# Patient Record
Sex: Female | Born: 1955 | Race: White | Hispanic: No
Health system: Western US, Academic
[De-identification: ages and names within clinical notes are randomized; demographics above are authoritative.]

---

## 2017-09-04 NOTE — Progress Notes (Deleted)
09/04/2017    Referring provider: Elsie Saas, MD    Chief Complaint:No chief complaint on file.      HISTORY OF PRESENT ILLNESS  Tamara Black is a 62 y.o. female ***         REVIEW OF SYSTEMS  {ROS:27880::"All other systems reviewed and are negative."}  All other systems were reviewed and are negative.      PAST MEDICAL HISTORY  No past medical history on file.    PAST SURGICAL HISTORY   No past surgical history on file.    MEDICATIONS      ALLERGIES  Allergies/Contraindications  Allergies not on file    SOCIAL HISTORY  Social History     Socioeconomic History    Marital status: Divorced     Spouse name: Not on file    Number of children: Not on file    Years of education: Not on file    Highest education level: Not on file   Social Needs    Financial resource strain: Not on file    Food insecurity - worry: Not on file    Food insecurity - inability: Not on file    Transportation needs - medical: Not on file    Transportation needs - non-medical: Not on file   Occupational History    Not on file   Tobacco Use    Smoking status: Not on file   Substance and Sexual Activity    Alcohol use: Not on file    Drug use: Not on file    Sexual activity: Not on file   Other Topics Concern    Not on file   Social History Narrative    Not on file       FAMILY HISTORY  No family history on file.    I have reviewed the past medical, family, and social history sections including the medications and allergies listed in the above medical record.     PHYSICAL EXAM  There were no vitals taken for this visit.  {Physical Exam:[id number]}    RESULTS    Labs  No results found for: WBC, RBC, HGB, HCT, MCV, MCH, MCHC, PLT  No results found for: CREAT, GFRC, GFRAA  No results found for: ALKP, GGT, ALT, AST, TBILI, ALB  No results found for: ESR  No results found for: CRP    Radiology  No results found.  Radiology reviewed and interpreted as ***    {OUTSIDE RECORDS REVIEWED:22244}     ASSESSMENT:                 ***  PLAN:  ***  I spent a total of *** minutes face-to-face with the patient and *** minutes of that time was spent counseling regarding signs and symptoms of recurrent infection,  educated the patient about adverse signs and symptoms associated with antibiotic usage which includes Clostridium difficile colitis, drug rashes, and specific adverse effects associated with the present antimicrobials prescribed as well as prognosis of infection.

## 2017-09-08 ENCOUNTER — Ambulatory Visit: Admit: 2017-09-08 | Discharge: 2017-09-08 | Payer: Commercial Managed Care - PPO

## 2017-09-08 DIAGNOSIS — N390 Urinary tract infection, site not specified: Secondary | ICD-10-CM

## 2017-09-08 NOTE — H&P (Signed)
No show

## 2017-09-08 NOTE — Progress Notes (Signed)
This encounter was created in error - please disregard.

## 2017-11-17 ENCOUNTER — Ambulatory Visit: Attending: Infectious Disease

## 2017-11-17 DIAGNOSIS — B999 Unspecified infectious disease: Secondary | ICD-10-CM

## 2017-11-17 DIAGNOSIS — J029 Acute pharyngitis, unspecified: Secondary | ICD-10-CM

## 2017-11-17 DIAGNOSIS — N390 Urinary tract infection, site not specified: Secondary | ICD-10-CM

## 2017-11-17 DIAGNOSIS — Z8619 Personal history of other infectious and parasitic diseases: Secondary | ICD-10-CM

## 2017-11-17 MED ORDER — METHENAMINE HIPPURATE 1 G PO TABS
1 g | ORAL_TABLET | Freq: Two times a day (BID) | ORAL | 3 refills | Status: AC
Start: 2017-11-17 — End: ?

## 2017-11-17 NOTE — Progress Notes
INFECTIOUS DISEASES CONSULTATION    Patient: Janet Conway  MRN: [id number]  DOB: 1955-08-04  Date of Service: 11/17/2017  Requesting Physician: Charlett Nose.,*  Reason for Consultation: recurrent UTI    Chief Complaint:   Chief Complaint   Patient presents with   ??? Urinary Tract Infection     chronic       History of Present Illness:   Janet Conway is a 62 y.o. female history of melanoma here today for recurrent UTI    PCP Dr. Roswell Nickel ([facility] in [place])  Lives in [place] and [place], [place].  Comes to [facility] for medical care.  History of bilateral Nephrolithasis with lithotripsy in 11/2016 and 12/2016 at [facility]), all of renal calculi removed, performed by Dr. Laurian Brim (retired)  History of pyelonephritis in 1990s, needed IV antibiotics  Gets yeast infection with antibiotics    Frequently gets UTIs.  Started having recurrent UTIs in 1983.  Last UTI 08/2017 and 09/2017.  Reports UTI symptoms are typically urinary frequency, RLQ pain and bladder pain, and urine gets dark and cloudy.  Does not get dysuria typically.  Vaginal itching with antibiotics.    Has taken Macrodantin, Cipro, Bactrim, clindamycin in the past without allergy.  Tried post-coital antibiotics in the past.  Abstinent from intercourse for 7 years.  Underwent menopause at age 47.  +atrophic vaginitis.  No vaginal dryness.  Tried Premarin for a couple of weeks, no real improvement and has been off of it.    D-mannose stopped working.  Took for several years.  Vitamin C, taking 2000 mg a few times a day.  Still taking Vitamin C.   Has tried suppressive Bactrim and Nitrofurantoin in the past.  In terms of other treatment for her recurrent UTIs, she has also tried douching with yogurt.      Just returned from [place] yesterday after stayng here for 3 months since 08/2017.  Feels like she is having a sore throat today.  Developed dysuria in [place] and she did not go to the clinic and bought antibiotics OTC in [place]. Was on antibiotics for 3 months (Cipro) because the urinary symptoms kept returning.  Does not recall name of antibiotics that she purchased.  In [place], was diagnosed with possible SLE.  She received a betamethasone injection by a physician in [place]   History of BV, feels like she is having a fishy smell from her vagina   Has seen urologist and OB/gyn.  No urinary incontinence.    Diagnosed with Staph infection of finger in the past  Patient is concerned for colonization in the past.  Has taken Ceftin in the past and subsequently developed C.diff diarrhea  No allergies to cephalopsorins  Was on antibiotics for a number of years for acne (erythromycin)    Patient would also want to inquire if she has an underlying systemic infection or immunodeficiency that would explain her constellation of symptoms including recurrent infections including her recurrent UTIs, hair thinning, sensation of sore throat, throat fullness, prior history of vocal cord polyps which were removed, deepening of her voice.  +Prior history of smoking.    Review of Systems:  A 14-point review of systems was performed and is negative except for as noted above.     Past Medical History:  Past Medical History:   Diagnosis Date   ??? Depression    ??? Insomnia    ??? Melanoma (HCC/RAF)    ??? UTI (urinary tract infection)  Past Surgical History:  Past Surgical History:   Procedure Laterality Date   ??? LYMPH NODE BIOPSY         Allergies:   Allergies   Allergen Reactions   ??? Amoxicillin Anaphylaxis   ??? Azithromycin      Blisters around eyes and neck   ??? Cefuroxime      cdiff   Amoxicillin, difficulty breathing facial swelling, blisters on feet  Azithromycin, blisters around eyes and neck, no anaphylaixs    Medications:  Current Outpatient Prescriptions   Medication Sig   ??? amitriptyline 100 mg tablet Take 2 tablets by mouth at bedtime.     No current facility-administered medications for this visit.        Family History: Family History   Problem Relation Age of Onset   ??? Heart disease Mother    ??? Hypertension Mother    ??? Melanoma Father      Social History:  Social History     Social History   ??? Marital status: Married     Spouse name: N/A   ??? Number of children: N/A   ??? Years of education: N/A     Social History Main Topics   ??? Smoking status: Former Smoker   ??? Smokeless tobacco: Former Neurosurgeon   ??? Alcohol use No   ??? Drug use: No   ??? Sexual activity: Not Asked     Other Topics Concern   ??? None     Social History Narrative   ??? 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 Denies???drug or EtoH use.     Physical Exam:  Vitals:    11/17/17 1033   Weight: 149 lb (67.6 kg)   Height: 5' 8'' (1.727 m)     Last Recorded Vital Signs:    11/17/17 1033   BP: 130/79   Pulse: 89   Temp: 36.8 ???C (98.2 ???F)   SpO2: 97%     General: Non-toxic appearing well nourished, in no acute distress.  Eyes: Anicteric sclerae, no injection, extraocular movements are intact.  ENT: No oropharyngeal lesions, mmm, and normal dentition. Right cervical lymphadenopathy.    Neck: Supple with no appreciable masses.  Lymph: No cervical, supraclavicular, or axillary adenopathy bilaterally.  CV: Regular rate and rhythm. Normal S1 and S2. No appreciable murmurs, rubs, or gallops. 2+ distal pulses.   Pulm: Normal effort. Clear to auscultation bilaterally without rales, rhonchi, or wheezing.   GI: Soft, non-distended, and non-tender. Normoactive bowel sounds. No guarding or rebound. No CVA tenderness.    Musculoskeletal: No clubbing, cyanosis, or edema.   Skin: No visible rash or lesions. Has normal turgor  Neuro: Awake and alert, moving all 4 extremities. Grossly nonfocal exam.   Psych: Appropriate mood and affect. Normal judgment and insight.    Laboratory Data:   No results found for: WBC, HGB, HCT, MCV, PLT  No results found for: NA, K, CL, CO2, BUN, CREAT, CALCIUM, MG, PHOS  CrCl cannot be calculated (No order found.). No results found for: TOTPRO, ALBUMIN, BILITOT, BILICON, ALT, AST, ALKPHOS, GGT, AMYLASE, LIPASE    Outside labs  10/29/17 Cr 1.0    Microbiology:   08/07/17 Urine culture negative  05/23/17 Urine culture negative  05/07/18 Urine culture gram positive bacteria  12/27/16 urine culture negative    Imaging reviewed by me:     Radiology report reviewed 08/01/15:  CT A/P with contrast  Impression:   1. No acute process identified at  the abdomen or pelvis.  2. Moderate colonic stool volume which may represent  constipation.  3. Bilateral nonobstructive renal calculi. No hydronephrosis.    Assessment/Recommendations:   Diagnoses and all orders for this visit:    Recurrent infections.  Reports concerns for immunodeficiency given recurrent UTIs and recent history of staph infection of finger.  Overall low low suspicion for underlying immunodeficiency but will check T-cell sbuset and quantitative immunoglobulins  -     CBC & Auto Differential; Future  -     Comprehensive Metabolic Panel; Future  -     T-cell Subset; Future  -     Immunoglobulins, Quantitative (IgG, IgA, IgM); Future  -     IgG Subclasses Profile; Future  -     CBC  -     Differential, Automated    Recurrent UTI.  Reports recurrent UTI since 1983.  Treated in the past for nephrolithiasis with lithotripsy.    -     Urinalysis,Routine, CLINIC COLLECT TODAY; Future  -     Bacterial Culture Urine, Clean Catch; Future  -     UA,Dipstick  -     UA,Microscopic    History of staph infection.  History of staph infection of finger now resolved. Will check for coloinzation with S.aureus.  -     Staph aureus Culture, Nares; Future    Sore throat. Onset a few days ago.  Suspect viral etiology vs post nasal drip, no tonsillar exudates or erythema of orpharynx on exam  -     Bacterial Culture Throat; Future    Other orders  -     methenamine hippurate 1 g tablet; Take 1 tablet (1 g total) by mouth two (2) times daily. -counseled patient that my clinical suspicion for underlying immunodeficiency as the causative etiology of her recurrent UTIs is low   -check CBC, CMP, quant immunoglobulin, T-cell subset, U/A with reflex urine culture   -nares swab for S.aureus colonization  -recommend methenamine 1 g twice a day take with Vitamin C 2000 twice a day to three times a day for her recurrent UTI  -throat swab obtained  -return to ID clinic in 8 weks    Author:  Nanetta Batty. Go-Wheeler, MD 11/17/2017 11:04 AM

## 2017-11-18 LAB — CBC: MEAN CORPUSCULAR HEMOGLOBIN: 32.1 pg (ref 26.4–33.4)

## 2017-11-18 LAB — Immunoglobulins, Quantitative: IGG,SERUM: 987 mg/dL (ref 726–1521)

## 2017-11-18 LAB — Differential Automated: IMMATURE GRANULOCYTES%: 0.2 (ref 0.00–0.04)

## 2017-11-18 LAB — Comprehensive Metabolic Panel
BILIRUBIN,TOTAL: 0.3 mg/dL (ref 0.1–1.2)
SODIUM: 141 mmol/L (ref 135–146)

## 2017-11-18 LAB — T-cell Subset: CD4, T-CELL HELPER/INDUCER SUBSET: 40 %{positive} (ref 28–63)

## 2017-11-18 LAB — UA,Microscopic: SQUAMOUS EPITHELIAL CELLS: 5 {cells}/uL (ref 0–17)

## 2017-11-18 LAB — UA,Dipstick: PH,URINE: 6 (ref 5.0–8.0)

## 2017-11-18 LAB — Bacterial Culture Throat: BACTERIAL CULTURE THROAT: NORMAL

## 2017-11-19 LAB — Bacterial Culture Throat: BACTERIAL CULTURE THROAT: NORMAL

## 2017-11-19 LAB — Bacterial Culture Urine: BACTERIAL CULTURE URINE: NO GROWTH {titer}

## 2017-11-19 LAB — Staph aureus Culture

## 2017-11-19 LAB — IgG Subclasses Profile: IGG SUBCLASS 2: 145 mg/dL (ref 124–549)

## 2019-01-10 IMAGING — MR MRI KNEE LT WO CONTRAST
5 of 6 series · 32 of 40 positions shown · non-contrast
Comparison: None available.

INDICATION: Sprain of left knee/pain.
TECHNIQUE: Multiplanar, multisequence imaging of the left knee was performed without contrast.

[Series 2: t2_axial_fs · axial · 4.0mm · 0.44mm/px · z∈[-69,+36]mm · 6 of 22 slices shown]
[im 1/22]
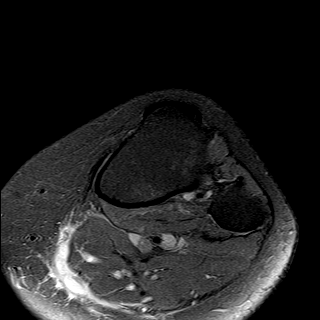
[im 5/22]
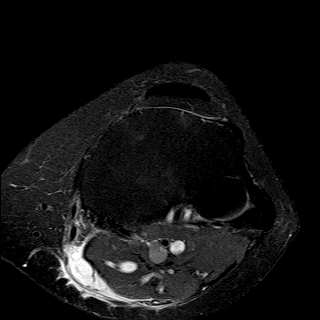
[im 9/22]
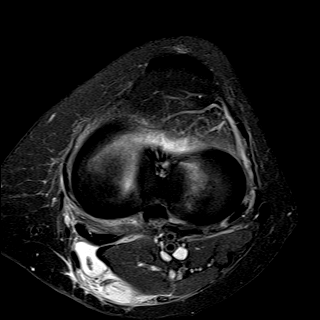
[im 13/22]
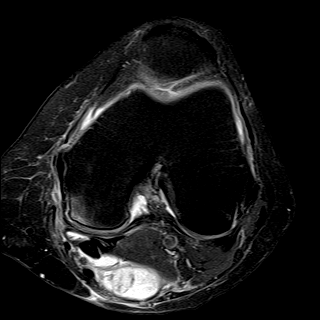
[im 17/22]
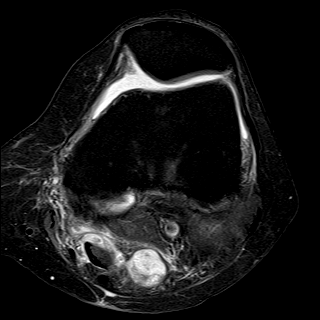
[im 22/22]
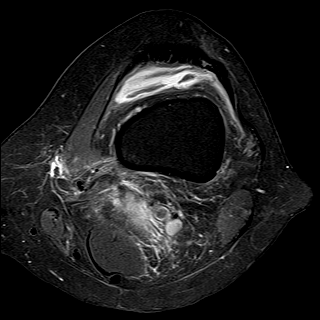

[Series 3: pd_sag fs · sagittal · 3.0mm · 0.44mm/px · 7 of 26 slices shown]
[im 1/26]
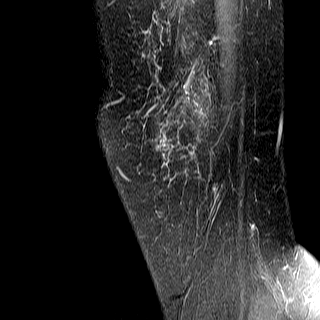
[im 5/26]
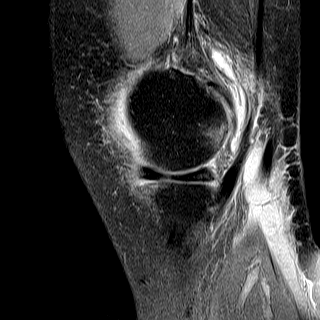
[im 9/26]
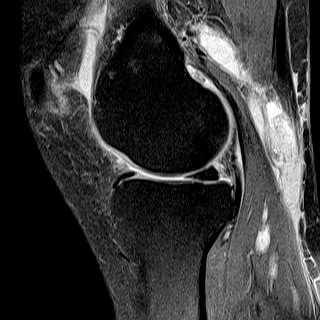
[im 13/26]
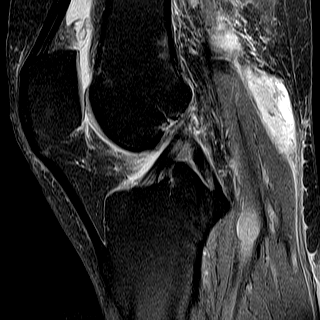
[im 17/26]
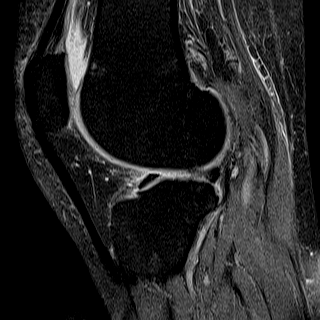
[im 21/26]
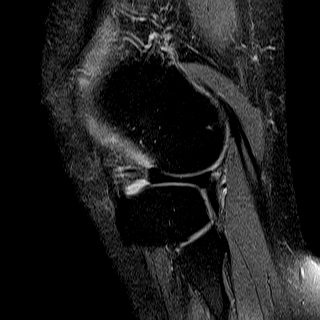
[im 26/26]
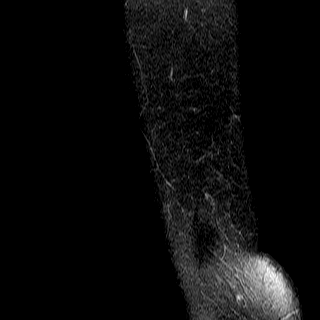

[Series 4: t2_sag_fs · sagittal · 3.0mm · 0.44mm/px · 7 of 26 slices shown]
[im 1/26]
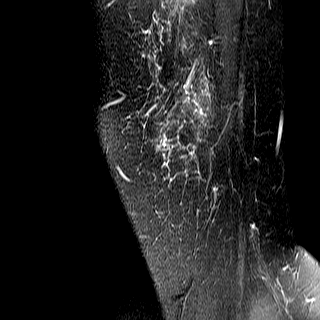
[im 5/26]
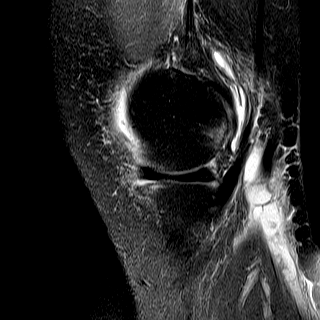
[im 9/26]
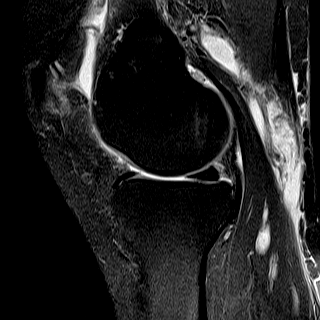
[im 13/26]
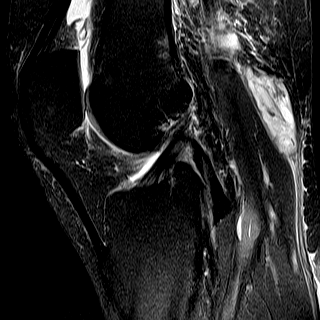
[im 17/26]
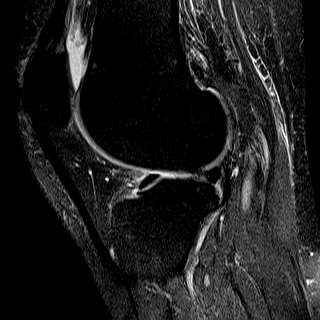
[im 21/26]
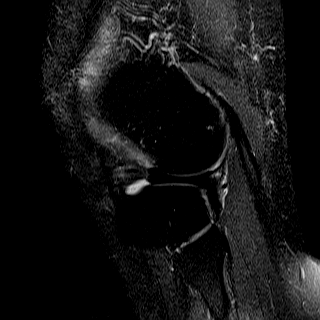
[im 26/26]
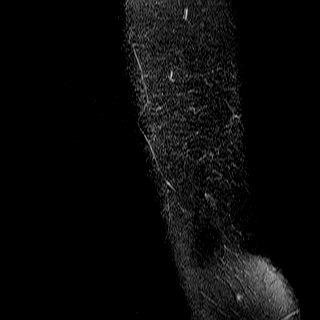

[Series 5: t1_cor · coronal · 4.0mm · 0.44mm/px · 6 of 24 slices shown]
[im 1/24]
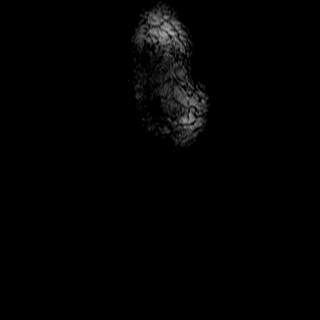
[im 5/24]
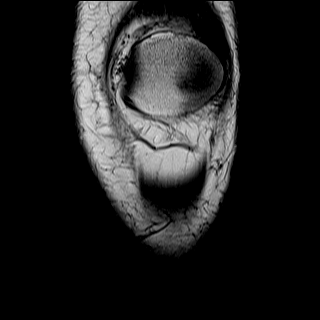
[im 10/24]
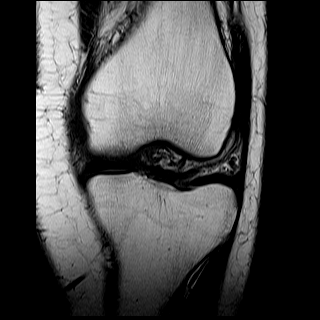
[im 14/24]
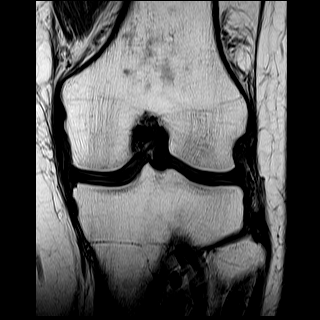
[im 19/24]
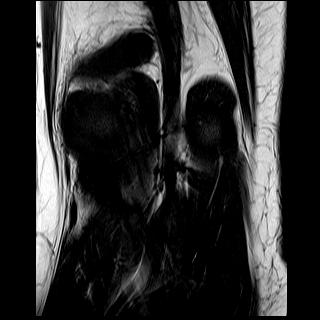
[im 24/24]
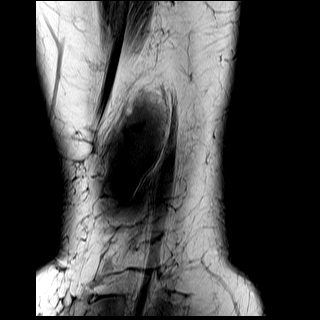

[Series 6: t2_cor_fs · coronal · 4.0mm · 0.44mm/px · 6 of 24 slices shown]
[im 1/24]
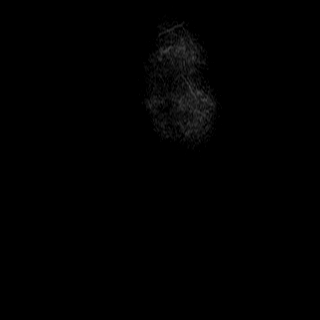
[im 5/24]
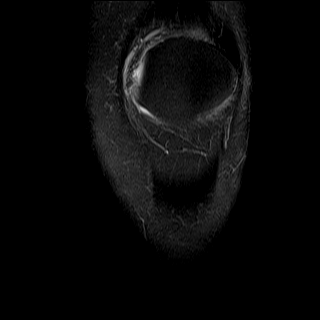
[im 10/24]
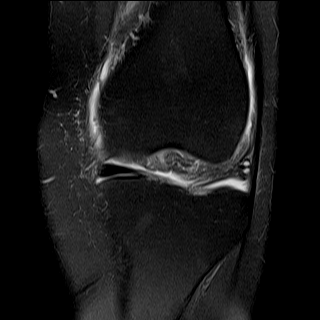
[im 14/24]
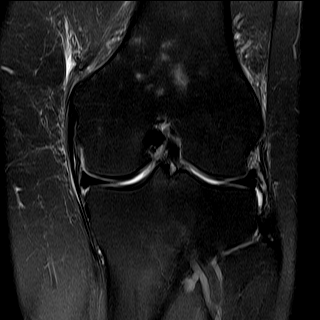
[im 19/24]
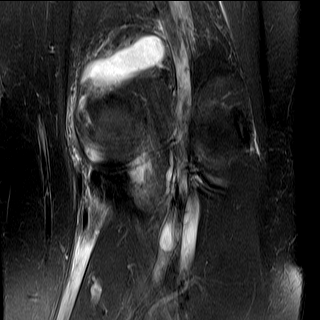
[im 24/24]
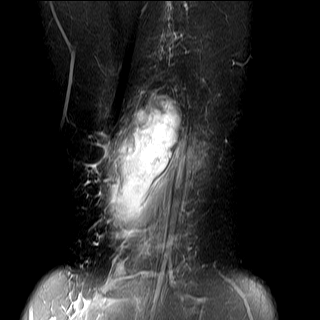

[32 of 40 positions shown; findings below may reference images not displayed]

FINDINGS: OSSEOUS: No acute fracture, avascular necrosis or aggressive osseous lesion.

MEDIAL JOINT COMPARTMENT:

Medial meniscus: Intermediate signal within the posterior horn of the medial meniscus likely reflecting mild myxoid degeneration. No articular surface communicating tear.

Articular cartilage: Full-thickness chondral fissuring of the far posterior nonweightbearing medial femoral condyle articular cartilage with moderate underlying subcortical marrow edema (series 2, image 10). The weightbearing medial compartment articular cartilage is maintained.

LATERAL JOINT COMPARTMENT:

Lateral meniscus: Fraying along the central free edge of the posterior horn.

Articular cartilage: Intact.

PATELLOFEMORAL JOINT AND EXTENSOR MECHANISM:

Quadriceps tendon: The visualized portion of the distal quadriceps tendon is intact.

Patella tendon: Mild proximal patellar tendinosis.

Articular cartilage: Superficial chondral fibrillation of the lateral patellar facet without underlying reactive osseous change. The trochlear articular cartilage is maintained.

Alignment: The alignment of the patellofemoral joint is within normal limits, in the imaged position.

LIGAMENTS:

Anterior cruciate ligament: Intact.

Posterior cruciate ligament: Intact.

Medial collateral ligament: Mild periligamentous edema. No fiber discontinuity or ligamentous laxity.

Lateral collateral ligament: Intact.

MUSCULOTENDINOUS: The visualized musculotendinous soft tissues about the knee are unremarkable.

OTHER: Moderate knee joint effusion. Large popliteal fossa cyst with internal synovitis, which is partially decompressed along the medial head of the gastrocnemius muscle.
IMPRESSION: 1.
Myxoid degeneration of the posterior horn medial meniscus without articular surface communicating tear. Small region of full-thickness chondral fissuring of the posterior nonweightbearing medial femoral condyle articular cartilage with underlying reactive osseous change. The weightbearing medial compartment articular cartilage is maintained.

2.
Mild edema adjacent to the superficial fibers of the medial collateral ligament is favored to be reactive to medial compartment pathology. A grade 1 sprain could give a similar imaging appearance. Correlate with history of prior injury and pain referable to the MCL.

3.
Fraying along the central free edge posterior horn lateral meniscus. The lateral compartment articular cartilage is maintained.

4.
Moderate knee joint effusion with large partially decompressed popliteal fossa cyst.

## 2019-01-31 IMAGING — MR MRI CSPINE WO/W CONTRAST
7 of 10 series · 31 of 48 positions shown · IV contrast (prohance)
Comparison: None

INDICATION: Spondylosis without myelopathy or radiculopathy, cervical region.
TECHNIQUE: Multiplanar, multiecho imaging of the cervical spine was performed prior to and following 15 mL ProHance intravenous contrast, including T1-weighted and fluid sensitive sequences.

[Series 2: bSSFP · axial · 6.0mm · 1.17mm/px · z∈[-51,+148]mm · 5 of 22 slices shown]
[im 1/22]
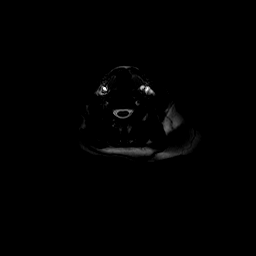
[im 6/22]
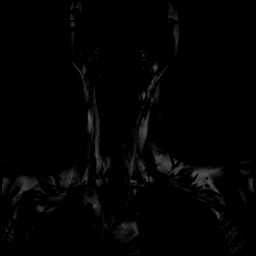
[im 11/22]
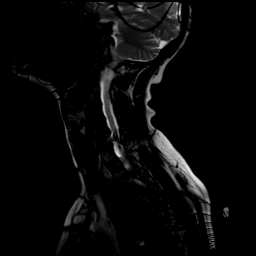
[im 16/22]
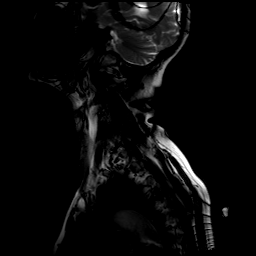
[im 22/22]
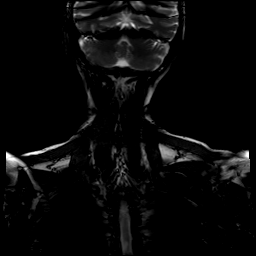

[Series 3: t2_cor · coronal · 3.5mm · 0.57mm/px · 3 of 16 slices shown]
[im 1/16]
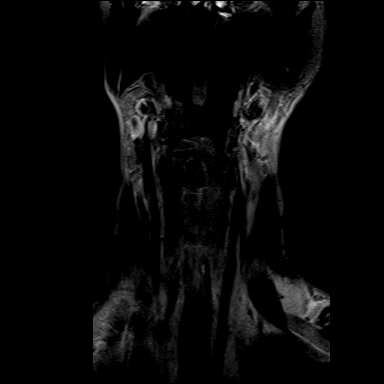
[im 8/16]
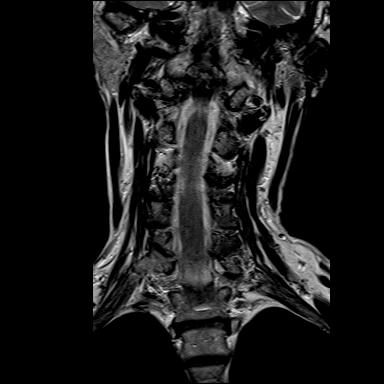
[im 16/16]
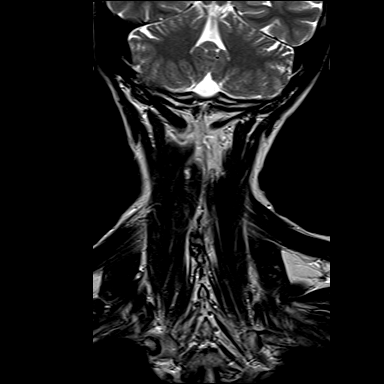

[Series 4: t2_sag · sagittal · 3.0mm · 0.57mm/px · 4 of 18 slices shown]
[im 1/18]
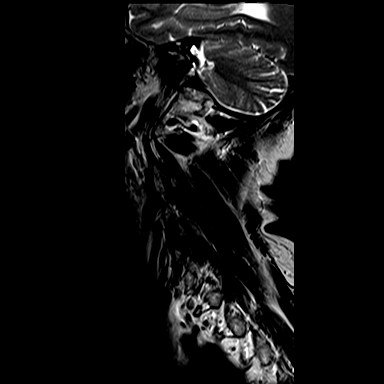
[im 6/18]
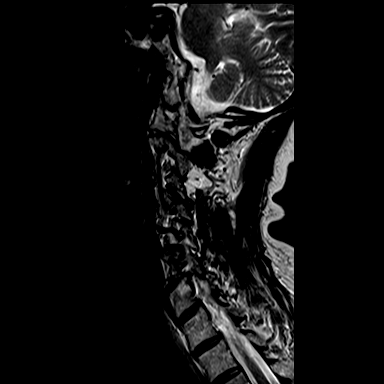
[im 12/18]
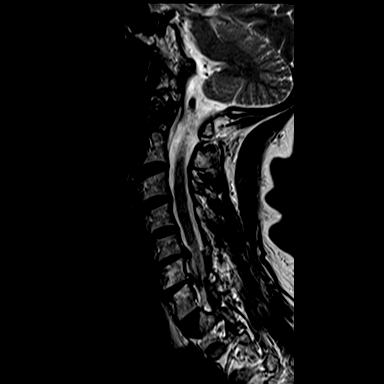
[im 18/18]
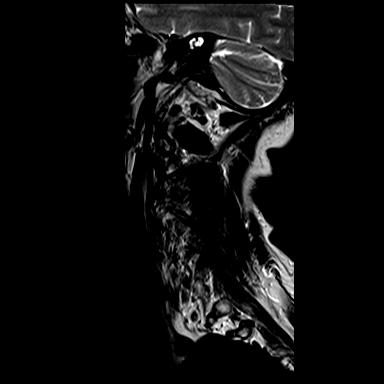

[Series 5: t1_sag · sagittal · 3.0mm · 0.57mm/px · 4 of 18 slices shown]
[im 1/18]
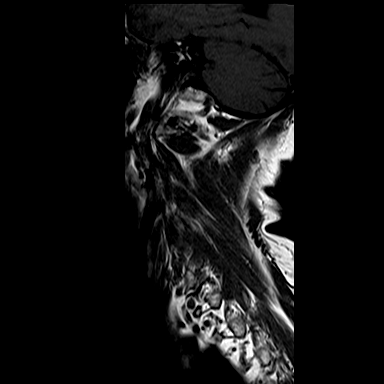
[im 6/18]
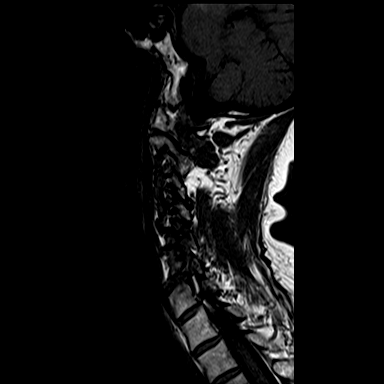
[im 12/18]
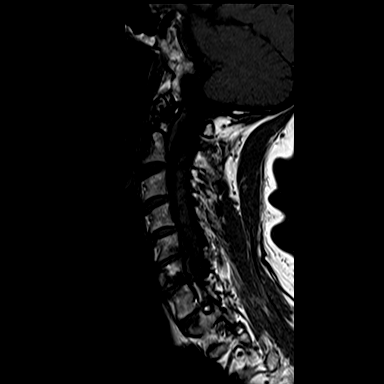
[im 18/18]
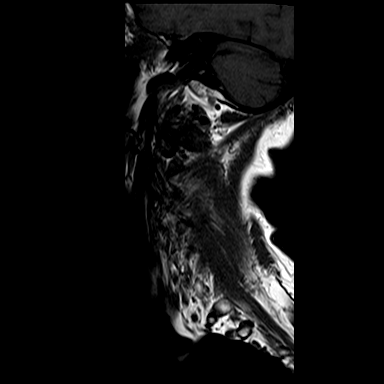

[Series 6: ir_sag · sagittal · 3.0mm · 0.69mm/px · 4 of 18 slices shown]
[im 1/18]
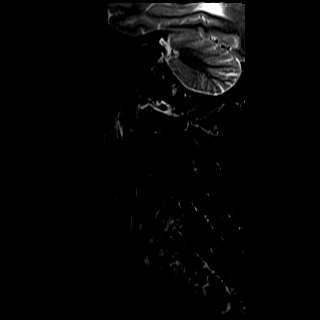
[im 6/18]
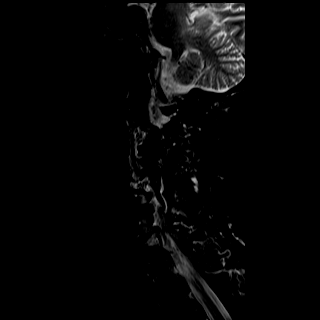
[im 12/18]
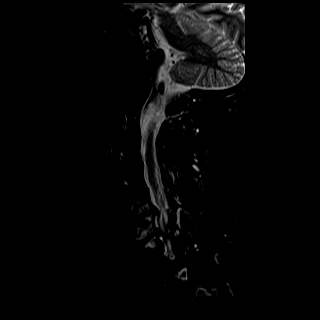
[im 18/18]
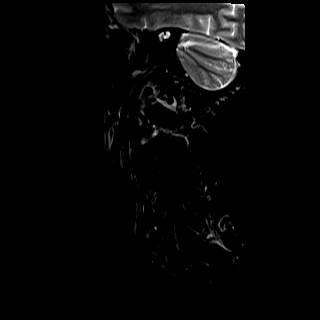

[Series 7: t2_medic_axial · axial · 3.0mm · 0.29mm/px · z∈[-67,+40]mm · 6 of 29 slices shown]
[im 1/29]
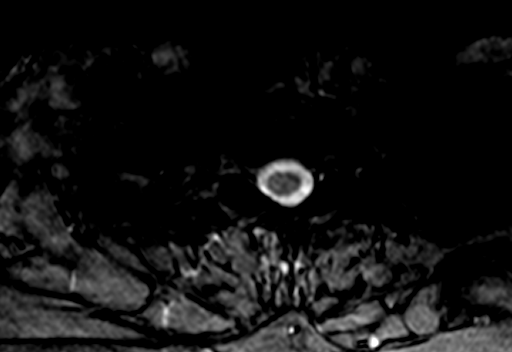
[im 6/29]
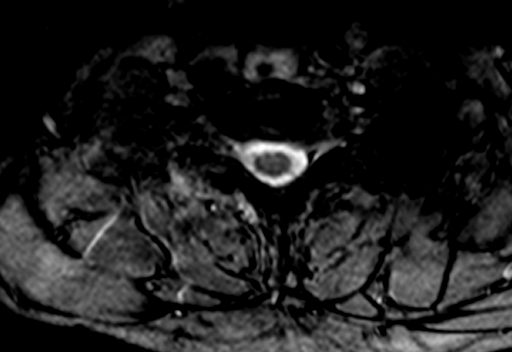
[im 12/29]
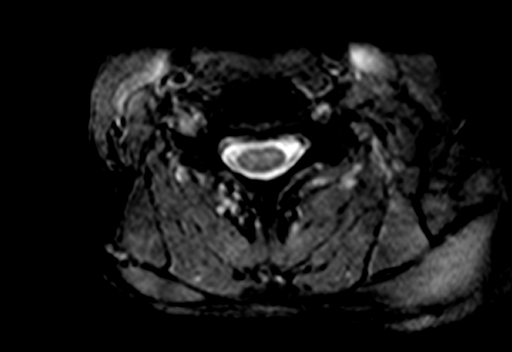
[im 17/29]
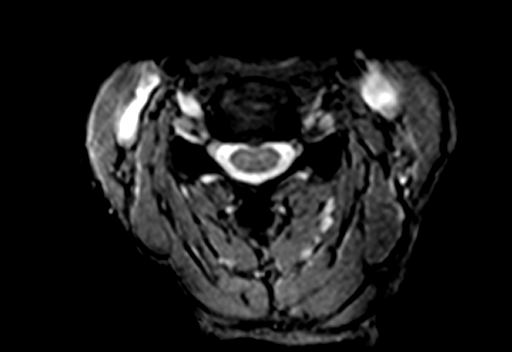
[im 23/29]
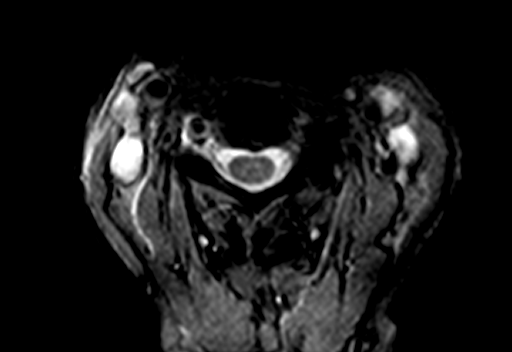
[im 29/29]
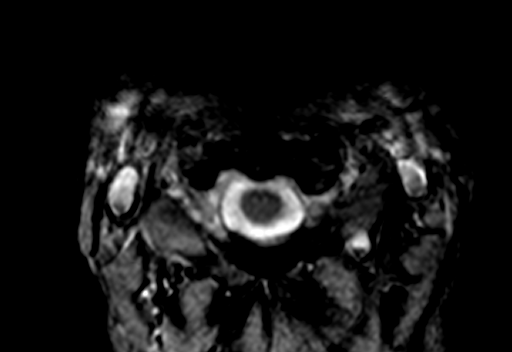

[Series 8: t1_axial_fs · axial · 3.0mm · 0.31mm/px · z∈[-74,+10]mm · 5 of 29 slices shown]
[im 1/29]
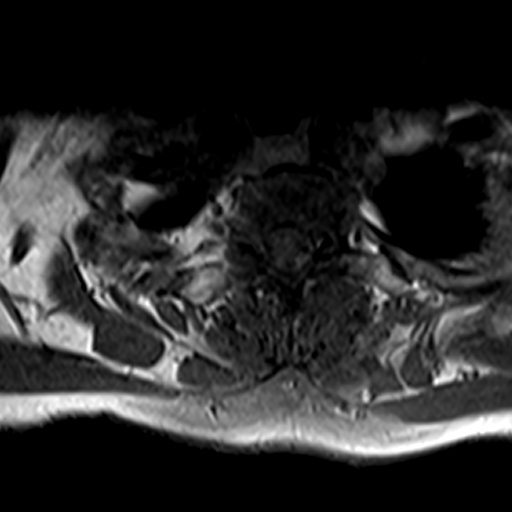
[im 6/29]
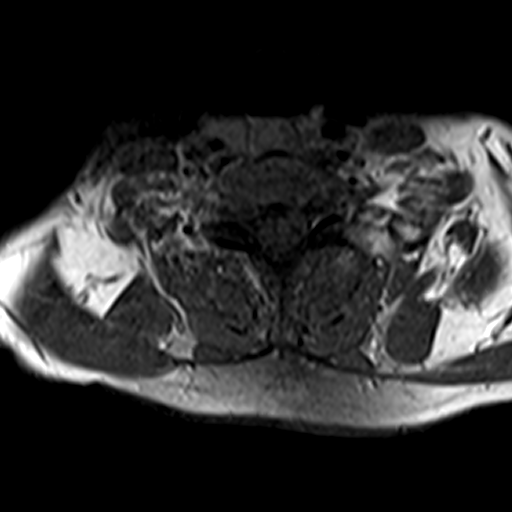
[im 12/29]
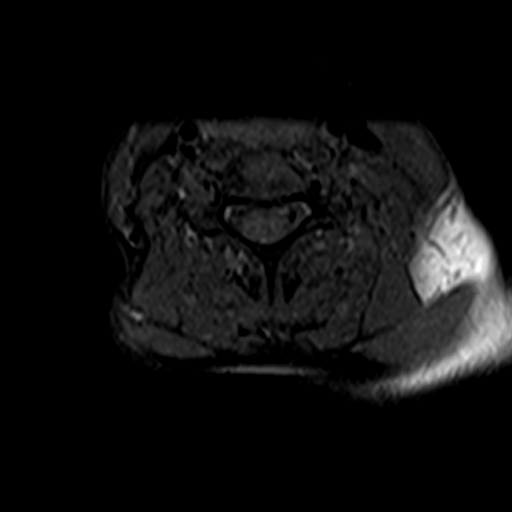
[im 17/29]
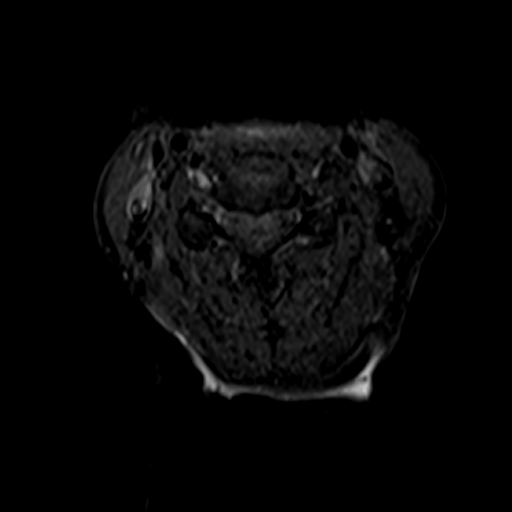
[im 23/29]
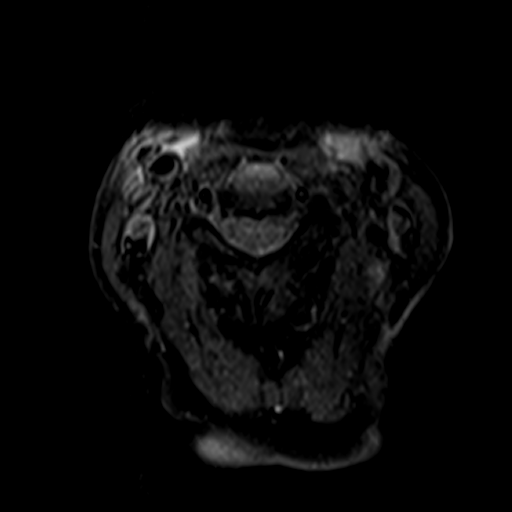

[31 of 48 positions shown; findings below may reference images not displayed]

FINDINGS: Posterior fossa: Subtle high T2 signal within the mid brain and pons, likely related to chronic ischemic disease.  

Cervical spinal cord:  The cervical spinal cord has normal signal intensity.  

Bone marrow:  No acute fracture. Moderate discogenic endplate reactive marrow changes at C5-C7. 

Alignment: Mild cervical dextroscoliosis. Mild retrolisthesis of C5-C6.

C2-C3: Mild disc osteophyte complex. Mild bilateral facet arthrosis. Mild canal stenosis. No foraminal stenosis.

C3-C4:  Mild disc osteophyte complex. Moderate left facet arthrosis. Mild canal stenosis. Moderate left foraminal stenosis.

C4-C5: Mild disc osteophyte complex. Minimal bilateral facet arthrosis. Minimal canal stenosis. No foraminal stenosis.

C5-C6: Moderate disc osteophyte complex. Minimal bilateral facet arthrosis. Moderate canal stenosis. Moderate bilateral foraminal stenosis.

C6-C7:  Moderate disc osteophyte complex. Ligamentum flavum thickening. Moderate canal stenosis. Minimal bilateral facet arthrosis. Mild bilateral foraminal stenosis.

C7-T1: No disc protrusion, canal narrowing, or foraminal narrowing.

T1-T3: No disc protrusion. No canal or foraminal stenosis.
IMPRESSION: 1. Moderate disc disease at C5-C7 with moderate canal and mild-moderate foraminal stenosis.

2. Mild disc osteophyte complex and moderate left facet arthrosis at C3-C4 with mild canal and moderate left foraminal stenosis.

3. Mild cervical dextroscoliosis.

4. Subtle high T2 signal within the midbrain and pons, partially imaged, likely related to chronic ischemic disease. Recommend MRI of the brain with and without contrast.

## 2019-05-19 IMAGING — MR MRI KNEE LT WO CONTRAST
5 series · 40 of 40 positions shown · IV contrast (gadolinium)
Comparison: MRI of left knee 01/10/2019.

HISTORY: Sprain of left knee.
TECHNIQUE: Multiplanar and multisequence MR imaging of the left knee was performed without the administration of intravenous gadolinium.

[Series 2: t2_axial_fs · axial · 4.0mm · 0.44mm/px · z∈[-59,+61]mm · 8 of 25 slices shown]
[im 1/25]
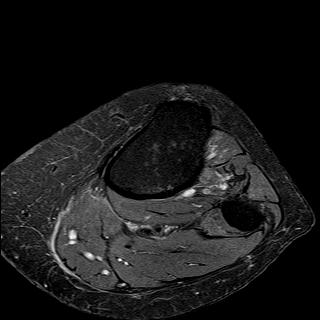
[im 4/25]
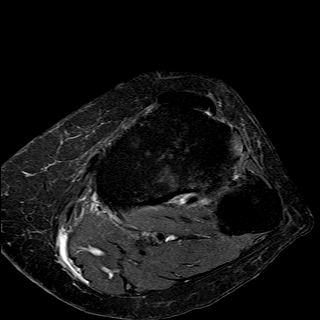
[im 7/25]
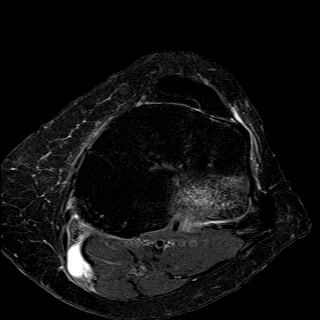
[im 11/25]
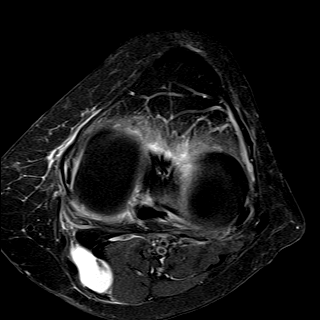
[im 14/25]
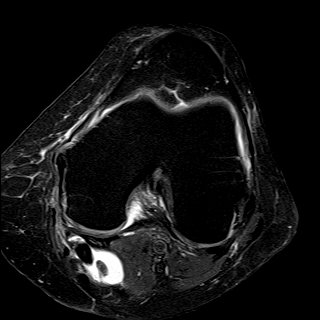
[im 18/25]
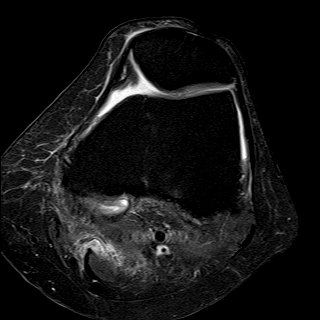
[im 21/25]
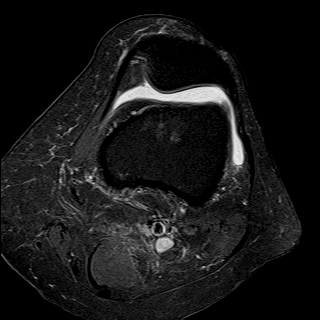
[im 25/25]
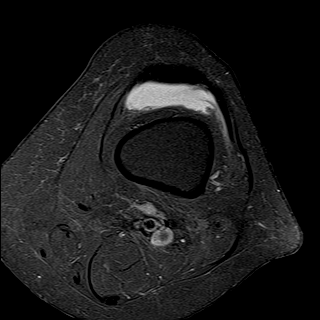

[Series 3: pd_sag fs · sagittal · 3.0mm · 0.44mm/px · 9 of 26 slices shown]
[im 1/26]
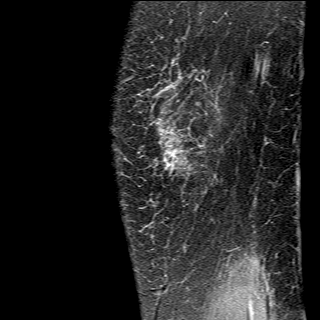
[im 4/26]
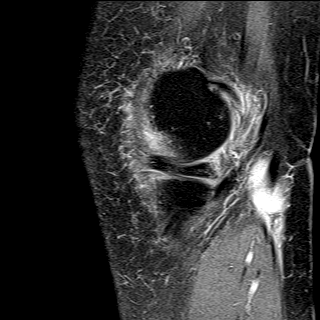
[im 7/26]
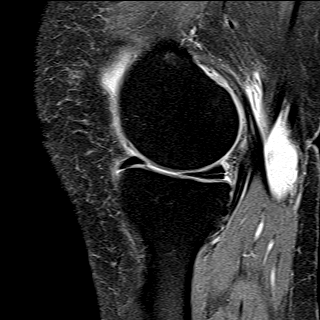
[im 10/26]
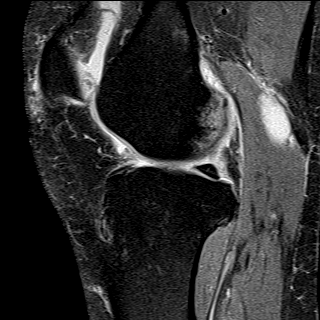
[im 13/26]
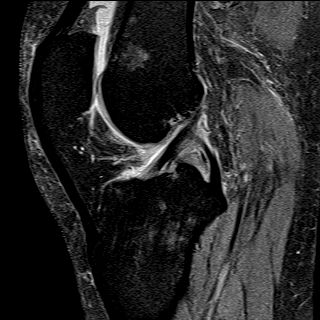
[im 16/26]
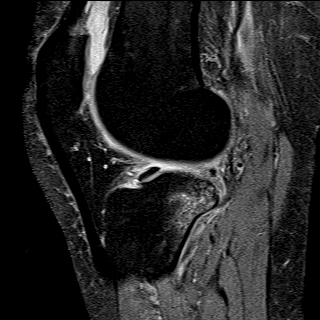
[im 19/26]
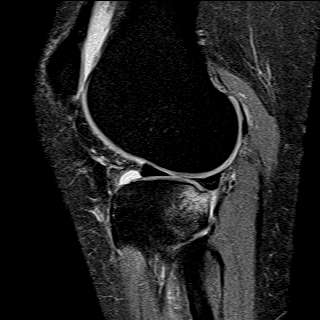
[im 22/26]
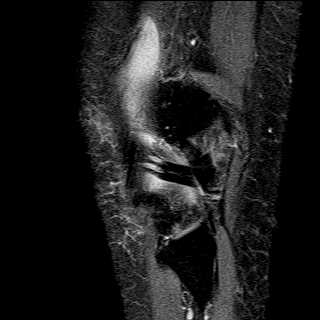
[im 26/26]
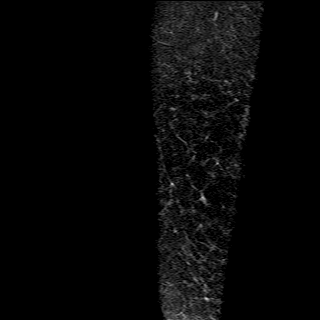

[Series 4: t2_sag_fs · sagittal · 3.0mm · 0.44mm/px · 9 of 26 slices shown]
[im 1/26]
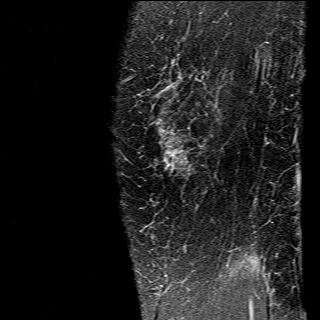
[im 4/26]
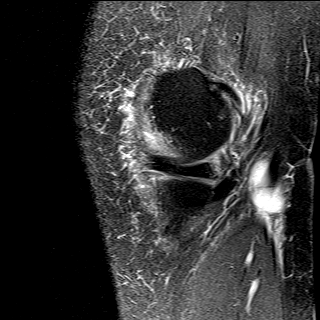
[im 7/26]
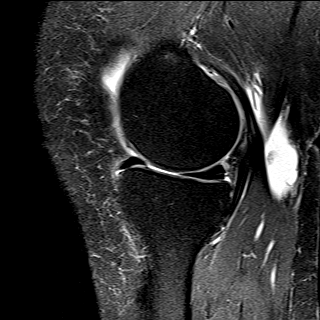
[im 10/26]
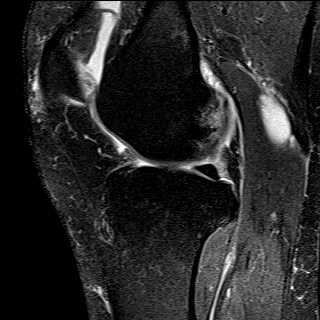
[im 13/26]
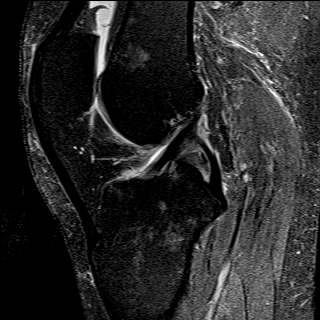
[im 16/26]
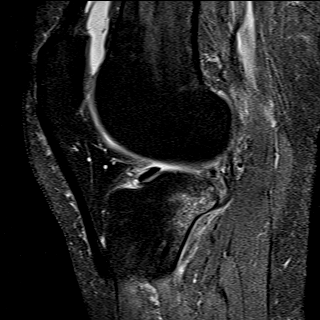
[im 19/26]
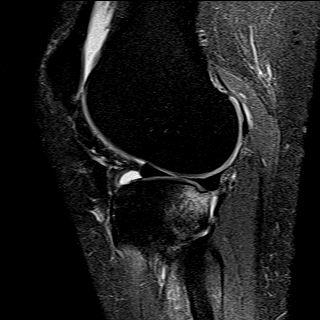
[im 22/26]
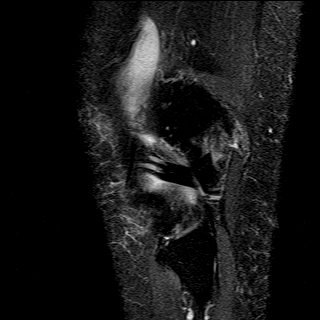
[im 26/26]
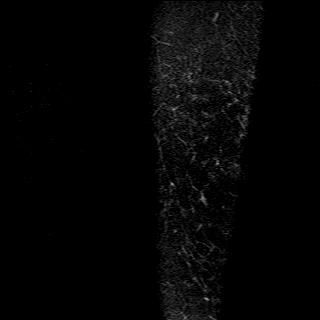

[Series 5: t1_cor · coronal · 4.0mm · 0.44mm/px · 7 of 22 slices shown]
[im 1/22]
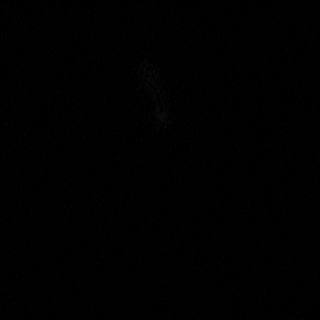
[im 4/22]
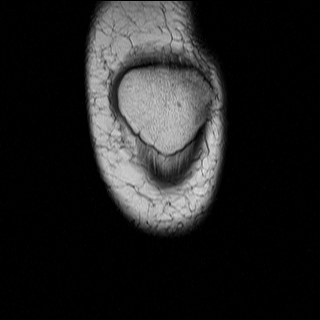
[im 8/22]
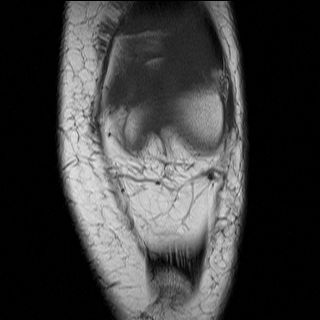
[im 11/22]
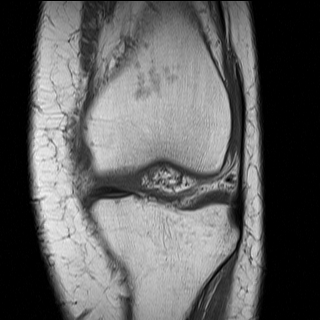
[im 15/22]
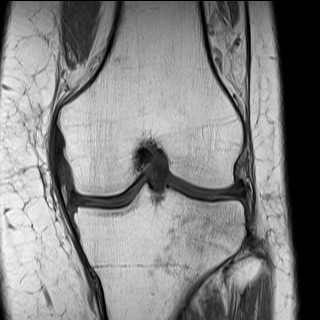
[im 18/22]
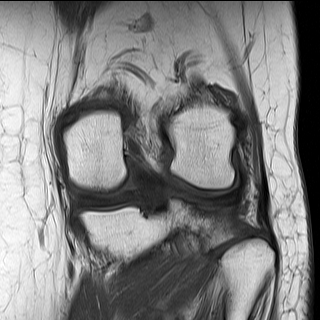
[im 22/22]
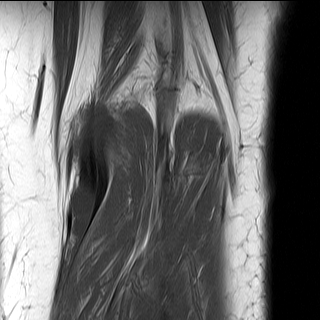

[Series 6: t2_cor_fs · coronal · 4.0mm · 0.44mm/px · 7 of 22 slices shown]
[im 1/22]
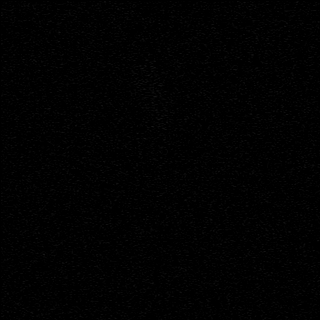
[im 4/22]
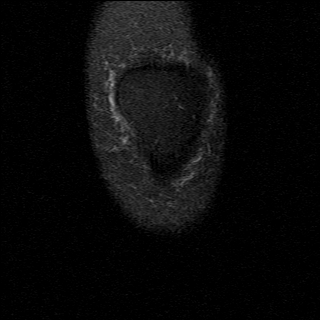
[im 8/22]
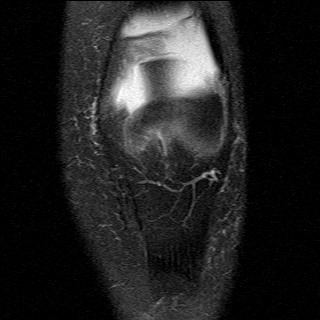
[im 11/22]
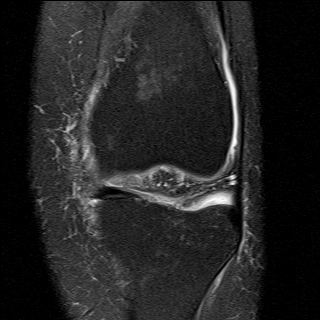
[im 15/22]
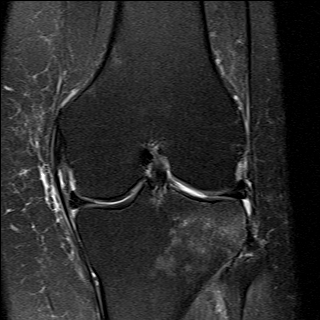
[im 18/22]
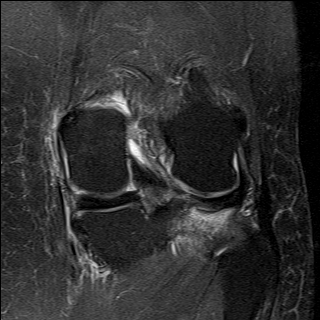
[im 22/22]
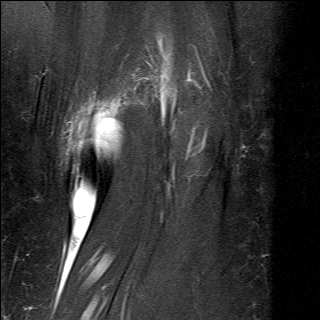

[40 of 40 positions shown; findings below may reference images not displayed]

FINDINGS: There is a low to intermediate-grade partial tear, deep fibers of medial collateral ligament with associated meniscocapsular separation.

The anterior cruciate, posterior cruciate, and lateral collateral ligaments are intact.

Medial meniscus intact. Intrasubstance degeneration of the posterior horn, similar in appearance to prior. Large radial tear, posterior horn of lateral meniscus near the tibial anchor. Associated extrusion of the body of the lateral meniscus.

Moderate to large effusion.

Interval development of a small mildly depressed posterolateral tibial plateau fracture. Moderate amount of associated osseous edema. No associated femoral contusion. Patella and fibula maintain normal signal, as well.

Articular surfaces appear adequately maintained.

Extensor mechanism intact. Patellar retinacula are unremarkable. Signal from muscle normal. Muscle mass normal. Small Baker's cyst with a few loose bodies. Partial rupture. Associated mild pes anserine bursitis. No solid lesions.
IMPRESSION: 1. Interval development of a small mildly depressed posterolateral tibial plateau fracture with a moderate amount of associated osseous edema. No associated femoral contusion. No femoral fractures.

2. Moderate to large effusion.

3. Large radial tear, posterior horn of lateral meniscus near the tibial anchor. Associated extrusion of the body of the lateral meniscus.

4. Low to intermediate-grade partial tear, deep fibers of medial collateral ligament with associated meniscocapsular separation.

IMPORTANT FINDINGS!

STAT Fax

## 2019-06-07 IMAGING — MR MRI BRAIN W/WO CONTRAST
12 series · 48 of 48 positions shown · IV contrast (prohance)
Comparison: None.

HISTORY: 63-year-old female with cerebral ischemia.
TECHNIQUE: Multiplanar, multisequence MRI images of the brain are obtained prior to and following intravenous contrast.

CONTRAST: 15 mL of ProHance

[Series 1: bSSFP · axial · 8.0mm · 1.17mm/px · 1 of 8 slices shown]
[im 1/8]
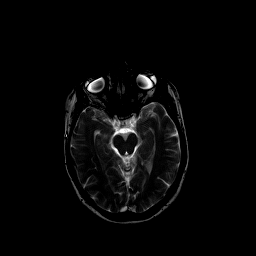

[Series 2: 3d_flair_space_sag · sagittal · 1.0mm · 0.84mm/px · 7 of 104 slices shown]
[im 1/104]
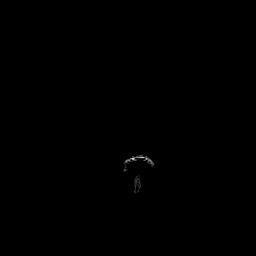
[im 18/104]
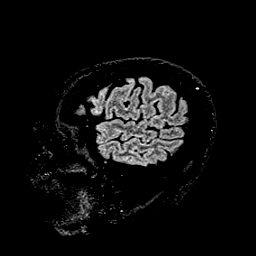
[im 35/104]
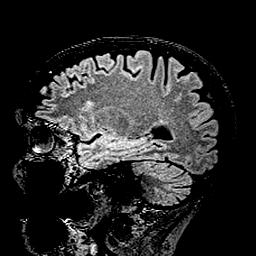
[im 52/104]
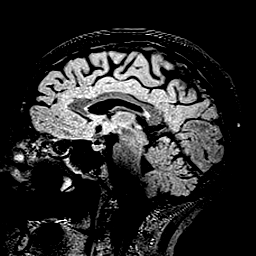
[im 69/104]
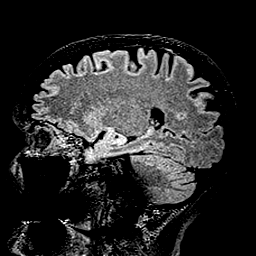
[im 86/104]
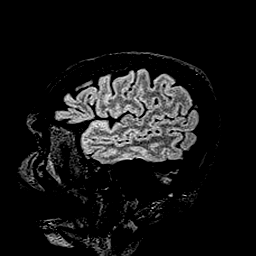
[im 104/104]
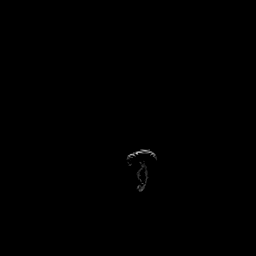

[Series 3: t2_axial · axial · 5.0mm · 0.72mm/px · 1 of 13 slices shown]
[im 1/13]
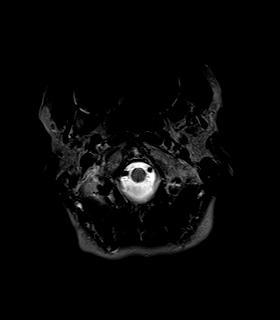

[Series 4: t1_axial · axial · 5.0mm · 0.72mm/px · 1 of 11 slices shown]
[im 1/11]
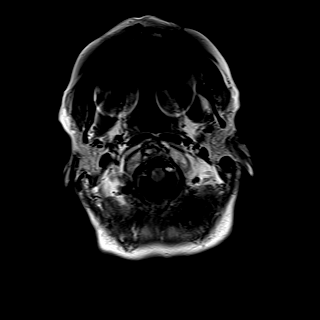

[Series 5: 3d_flair_axial_reformat · axial · 1.5mm · 0.83mm/px · z∈[-59,+85]mm · 6 of 83 slices shown]
[im 1/83]
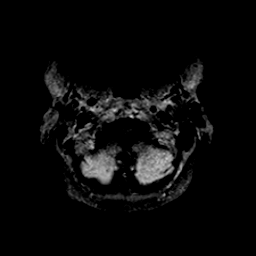
[im 17/83]
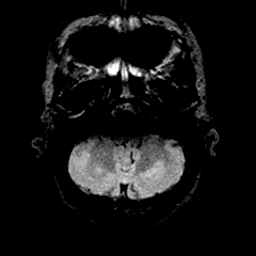
[im 33/83]
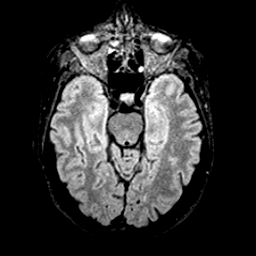
[im 50/83]
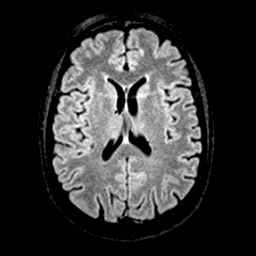
[im 66/83]
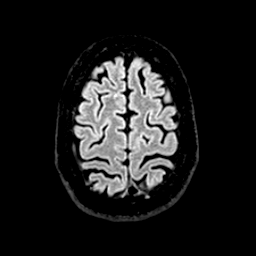
[im 83/83]
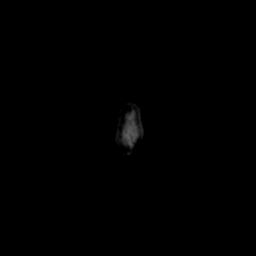

[Series 6: 3d_flair_cor_reformat · coronal · 1.5mm · 0.79mm/px · 7 of 103 slices shown]
[im 1/103]
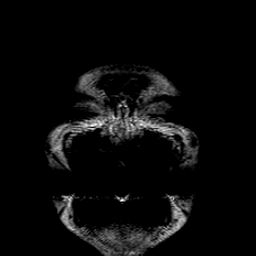
[im 18/103]
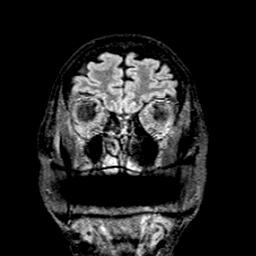
[im 35/103]
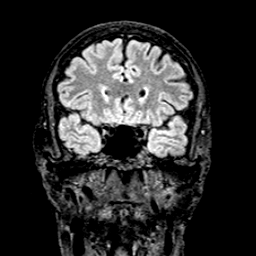
[im 52/103]
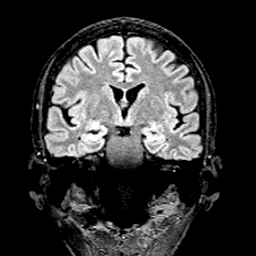
[im 69/103]
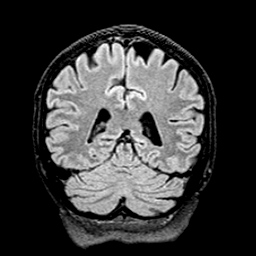
[im 86/103]
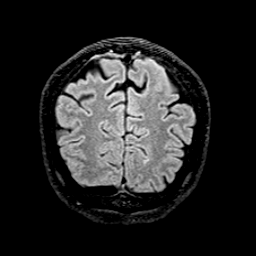
[im 103/103]
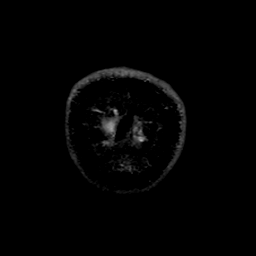

[Series 7: DWI · axial · 5.0mm · 1.26mm/px · 1 of 14 slices shown (1 of 2)]
[im 1/14]
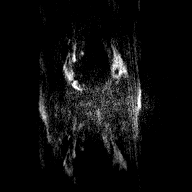

[Series 8: DWI · axial · 5.0mm · 1.26mm/px · 1 of 14 slices shown (2 of 2)]
[im 1/14]
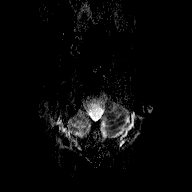

[Series 9: flash_axial · axial · 5.0mm · 0.45mm/px · 1 of 11 slices shown]
[im 1/11]
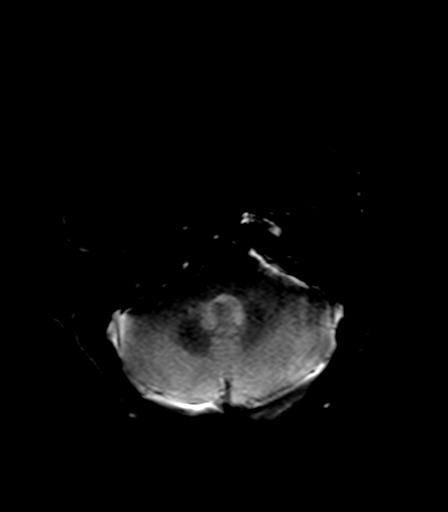

[Series 10: t1_mprage_axial_+c · axial · 1.0mm · 0.91mm/px · z∈[-69,+73]mm · 4 of 60 slices shown]
[im 1/60]
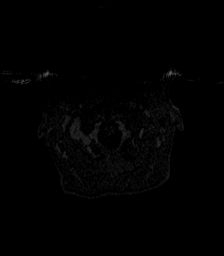
[im 20/60]
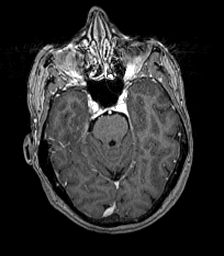
[im 40/60]
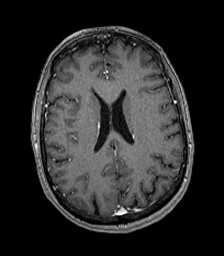
[im 60/60]
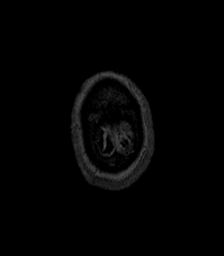

[Series 11: t1_mprage_sag_reformat_+c · sagittal · 1.5mm · 0.86mm/px · 8 of 111 slices shown]
[im 1/111]
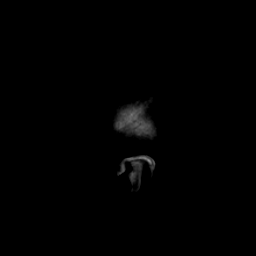
[im 16/111]
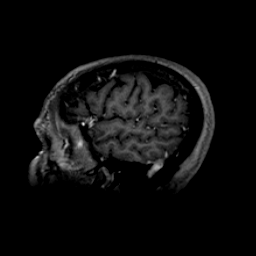
[im 32/111]
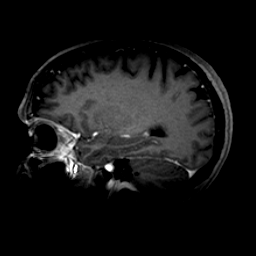
[im 48/111]
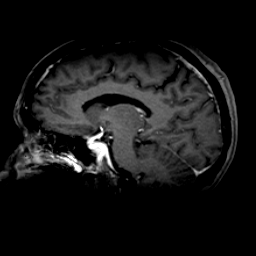
[im 63/111]
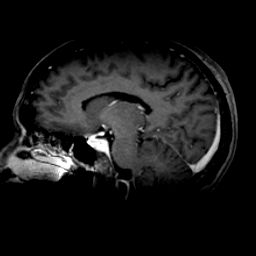
[im 79/111]
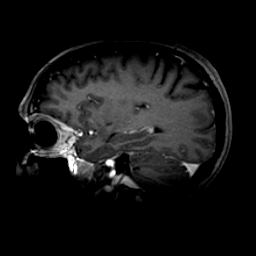
[im 95/111]
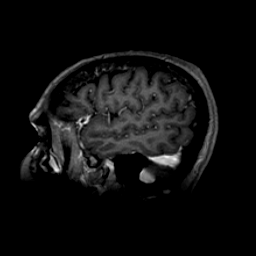
[im 111/111]
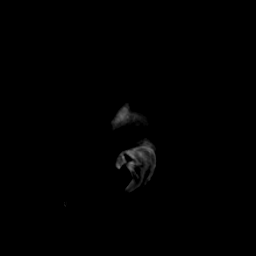

[Series 12: t1_mprage_cor_reformat_+c · coronal · 1.5mm · 0.80mm/px · 10 of 147 slices shown]
[im 1/147]
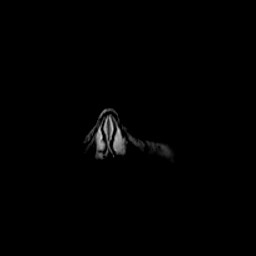
[im 17/147]
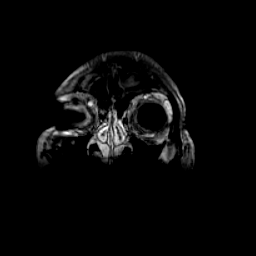
[im 33/147]
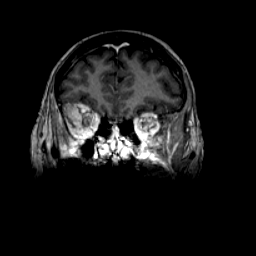
[im 49/147]
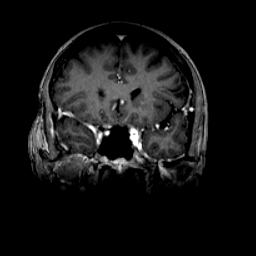
[im 65/147]
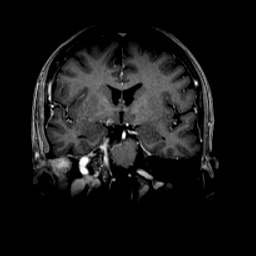
[im 82/147]
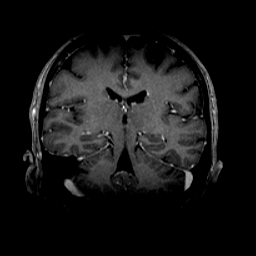
[im 98/147]
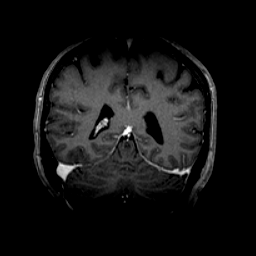
[im 114/147]
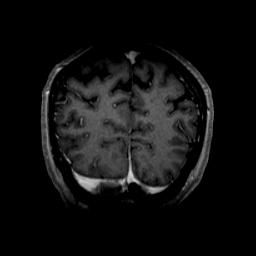
[im 130/147]
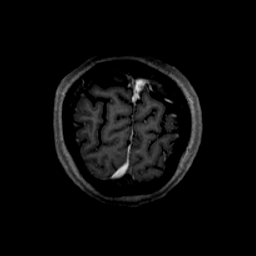
[im 147/147]
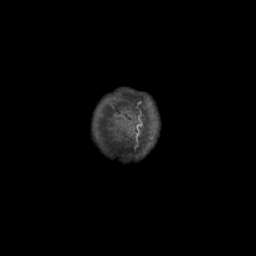

[48 of 48 positions shown; findings below may reference images not displayed]

FINDINGS: QUALITY: Study is limited due to metallic artifact from likely dental hardware.

BRAIN PARENCHYMA: No acute infarct. The signal previously noted in the midbrain and pons is less conspicuous on today's examination and may have been artifactual. No abnormal intracranial enhancement.

PITUITARY: Unremarkable.

VASCULATURE: Expected major intracranial flow voids are present.

VENTRICULAR SYSTEM: No hydrocephalus or midline shift.

EXTRA-AXIAL SPACES: No intra- or extra-axial fluid collections.

ORBITS: Unremarkable.

PARANASAL SINUSES AND MASTOID AIR CELLS: Paranasal sinuses are not visualized due to metallic artifact. Mastoid air cells are clear. 

BONES: Unremarkable.
IMPRESSION: No significant intracranial abnormalities.

## 2020-08-24 IMAGING — MR MRI SHOULDER RT WO CONTRAST
4 of 5 series · 32 of 40 positions shown · non-contrast
Comparison: None available.

INDICATION: Right shoulder pain.
TECHNIQUE: Multiplanar, multisequence MR imaging of the right shoulder without contrast.

[Series 3: t2_axial_fs · axial · 4.0mm · 0.50mm/px · z∈[-57,+30]mm · 7 of 22 slices shown]
[im 1/22]
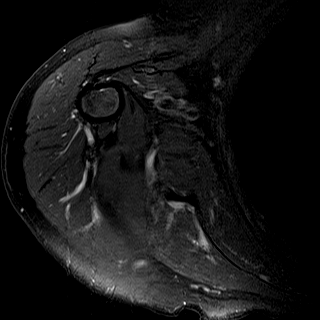
[im 4/22]
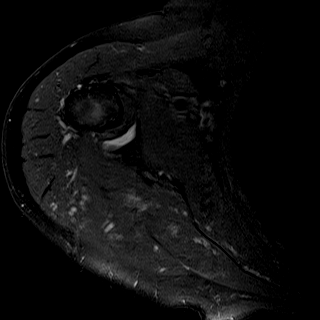
[im 8/22]
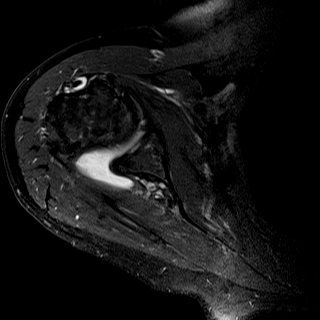
[im 11/22]
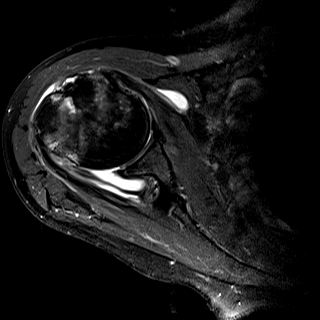
[im 15/22]
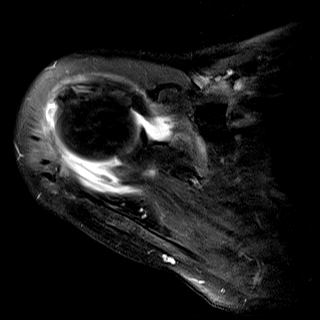
[im 18/22]
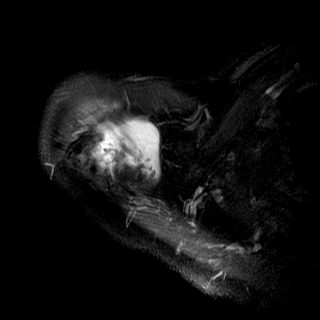
[im 22/22]
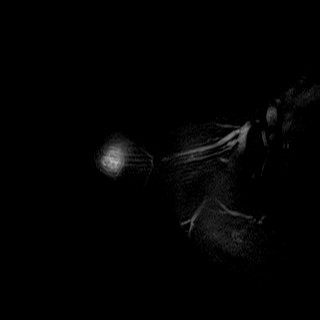

[Series 4: t2_cor_obl_fs · coronal · 4.0mm · 0.50mm/px · 7 of 20 slices shown]
[im 1/20]
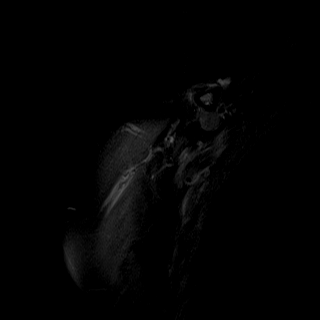
[im 4/20]
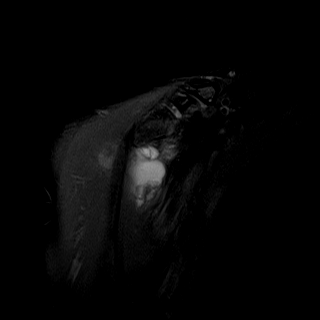
[im 7/20]
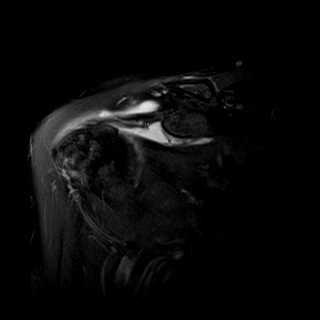
[im 10/20]
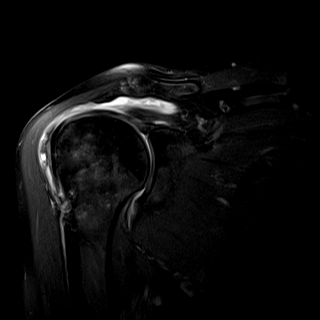
[im 13/20]
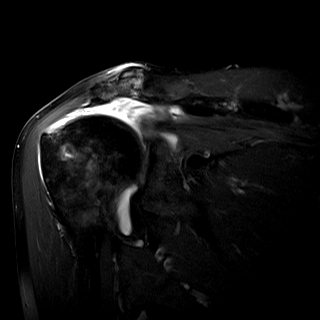
[im 16/20]
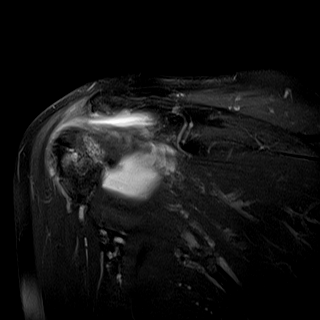
[im 20/20]
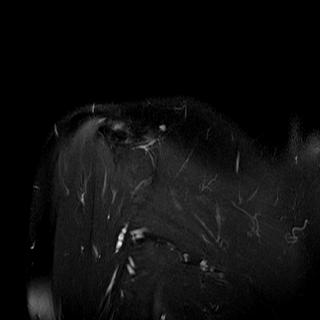

[Series 5: t1_sag_obl · sagittal · 4.0mm · 0.50mm/px · 9 of 27 slices shown]
[im 1/27]
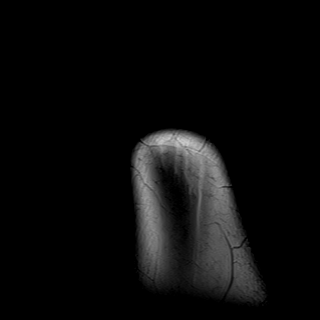
[im 4/27]
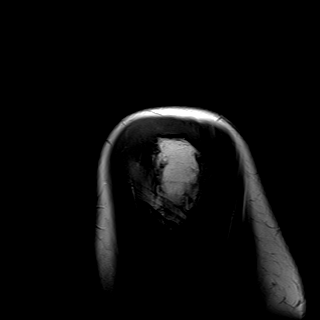
[im 7/27]
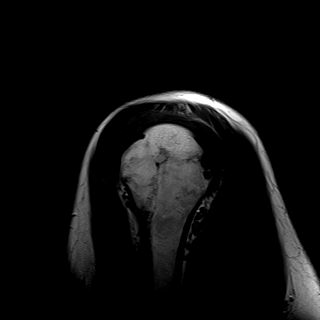
[im 10/27]
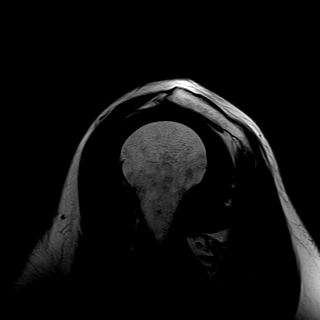
[im 14/27]
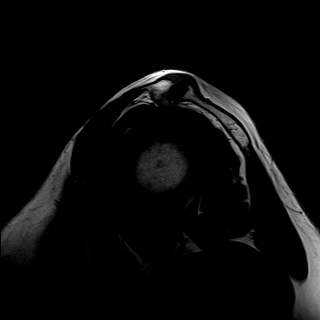
[im 17/27]
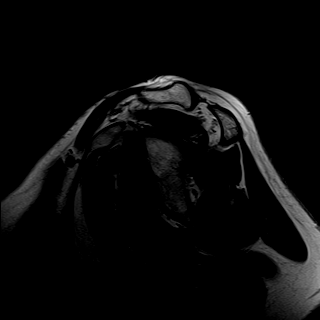
[im 20/27]
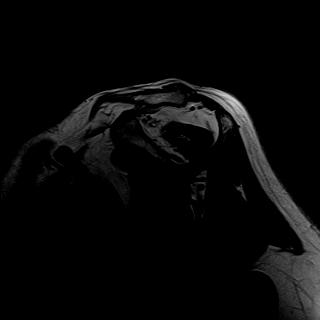
[im 23/27]
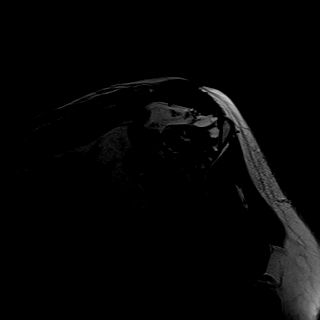
[im 27/27]
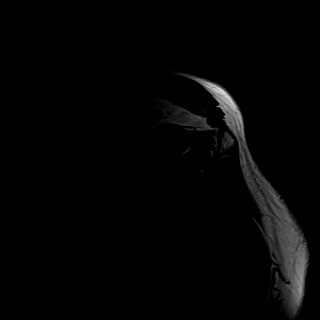

[Series 6: t2_sag_obl_fs · sagittal · 4.0mm · 0.50mm/px · 9 of 27 slices shown]
[im 1/27]
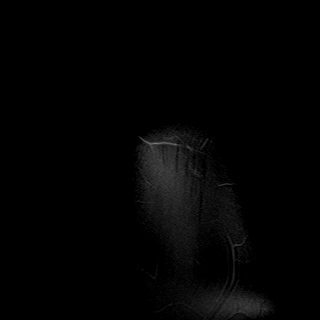
[im 4/27]
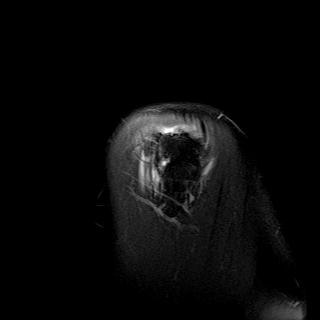
[im 7/27]
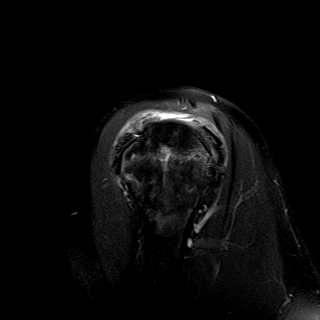
[im 10/27]
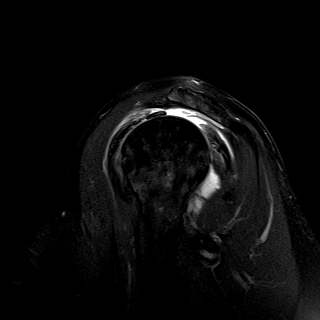
[im 14/27]
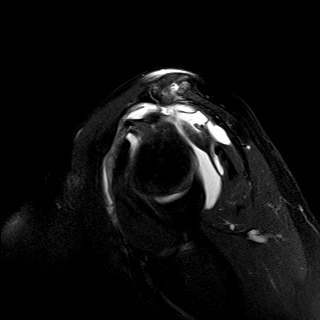
[im 17/27]
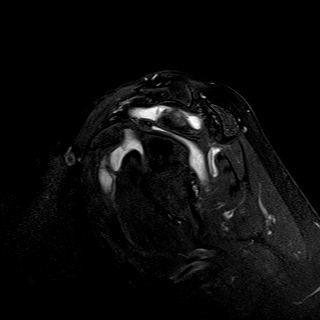
[im 20/27]
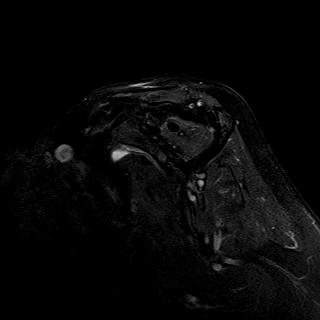
[im 23/27]
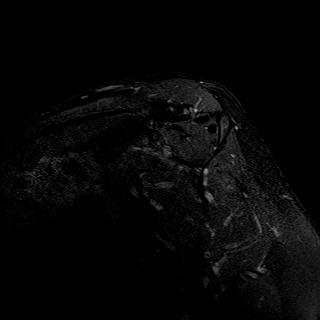
[im 27/27]
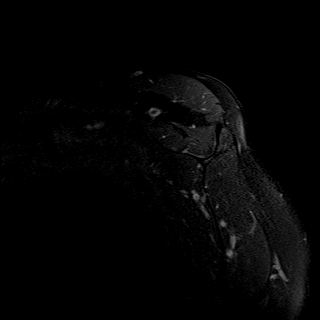

[32 of 40 positions shown; findings below may reference images not displayed]

FINDINGS: OSSEOUS: No acute fracture, avascular necrosis, or aggressive osseous lesion. 

ACROMIAL OUTLET: Mild acromioclavicular joint osteoarthritis. A large amount of fluid is present within the subacromial/subdeltoid bursa, which decompresses through a full-thickness rotator cuff tear. The acromion is non-hooked. The coracoclavicular and coracoacromial ligaments are intact.

ROTATOR CUFF:

Supraspinatus and infraspinatus: Full-thickness, full-width tear of the supraspinatus tendon, with medial tendon retraction to the level of the glenoid rim. Moderate infraspinatus tendinosis with low-grade partial-thickness articular surface tearing of the posterior tendon and small-volume intrasubstance delamination of fluid towards the infraspinatus myotendinous junction.

Teres minor: Intact.

Subscapularis: Mild tendinosis.

Fatty atrophy: Mild supraspinatus and infraspinatus muscle fatty atrophy. The teres minor and subscapularis muscles are maintained.

LABRUM: Degenerative tearing of the superior labrum.

BICEPS TENDON: Mild proximal intra-articular long head biceps tendinosis. The proximal extra-articular long head biceps tendon is intact and normally located.

GLENOHUMERAL JOINT: The glenohumeral articular cartilage is maintained. No focal chondral defect is identified. The glenohumeral joint is normal in alignment.

OTHER: The inferior glenohumeral ligament is unremarkable. Moderate glenohumeral joint effusion.
IMPRESSION: 1.
Complete tear of the supraspinatus tendon, with medial tendon retraction to the glenoid. Mild supraspinatus muscle fatty atrophy.

2.
Moderate infraspinatus tendinosis, with low-grade partial-thickness articular surface tearing of the posterior tendon footprint. The infraspinatus muscle is maintained.

3.
Mild proximal intra-articular long head biceps tendinosis with degenerative tearing of the superior labrum.

4.
Mild acromioclavicular joint osteoarthritis.

## 2022-09-22 IMAGING — PT PET CT SKULL BASE TO THIGH_STAGING
1 of 3 series · 1 of 25 positions shown · non-contrast
Comparison: None.

Images Obtained from [facility]
Height: 67 inches. Weight: 161 pounds.
INDICATION: History of melanoma left arm with enlarged lymph nodes, staging
TECHNIQUE: The patient's serum glucose was 82 mg/dL at time of the study.  The patient was intravenously injected with 11.50 mCi mCi F-18 Fluorodeoxyglucose in a Right Hand vein. The patient rested
quietly for 79 Minutes and then received attenuation corrected PET/CT imaging with Time of Flight Protocol from the skull base to mid thigh. PET, CT, and fused images were reviewed at the reading
station. SUV is corrected based on lean body mass. Images are adequate for review.

[Series 4: ct pet 4.0 br38 · axial · 4.0mm · 0.98mm/px · 1 of 320 slices shown]
[im 320/320  brain]
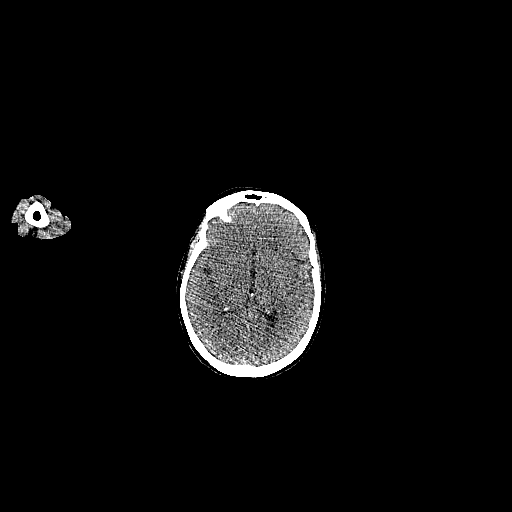

[1 of 25 positions shown; findings below may reference images not displayed]

FINDINGS: Mediastinum SUV max:
Liver SUV mean:
PERCIST threshold:
FINDINGS: HEAD/NECK:
PET: There is normal distribution of the radiopharmaceutical. No FDG avid adenopathy is seen. The thyroid gland is unremarkable.
CT:  Visualized intracranial structures and paranasal sinuses are unremarkable.
THORAX:
PET: The background mediastinal SUV max: 1.86 .No FDG avid adenopathy is seen.
CT: Multiple surgical clips are seen about the left chest and axilla. These do produce streak artifact. There are emphysematous changes in the lungs. There is a large hiatal hernia.
ABDOMEN/PELVIS:
PET:  Liver SUV mean:
PERCIST threshold:
No FDG avid adenopathy or findings to indicate metastatic disease.
CT: There are bilateral intrarenal calculus. There is right colon and sigmoid colon diverticulosis.
SKELETAL:
PET: There is focal increased FDG activity related to a left anterior rib fracture with callus formation. SUV max 1.87 (image 115).
CT: Note is made of an intramuscular lipoma about the right shoulder (image 93).
IMPRESSION: 1. There are no comparison studies. Surgical changes in the left chest and axilla. No findings to indicate recurrent or metastatic disease.
2.  Additional comments and findings as above.

## 2023-03-18 IMAGING — CT CT ABD/PEL W CONT
2 of 5 series · 11 of 46 positions shown, 12 images · IV contrast (isovue)
Comparison: PET/CT dated 09/22/2022.

Images Obtained from [facility]
HISTORY/INDICATION:  Constipation, acute hepatitis C. History of melanoma. Appendix is surgically absent.
TECHNIQUE: Scans of the abdomen and pelvis are performed at 3 mm increments, using only intravenous administration of 100 mL of Isovue 370 with coronal and sagittal reconstructions. Prostate serum
creatinine is 1.3.
Dose reduction technique used: Automated exposure control and adjustment of the mA and/or kV according to patient size. CT Studies and Cardiac Nuclear Medicine Studies in last 12-months = 0
Total radiation dose to patient is CTDIvol 23.67 mGy and DLP 695.60 mGy-cm.

[Series 6: venous · axial · portal-venous · 0.50mm/px · z∈[+1270,+1618]mm · 8 of 142 slices shown, 9 images]
[im 17/142  soft-tissue]
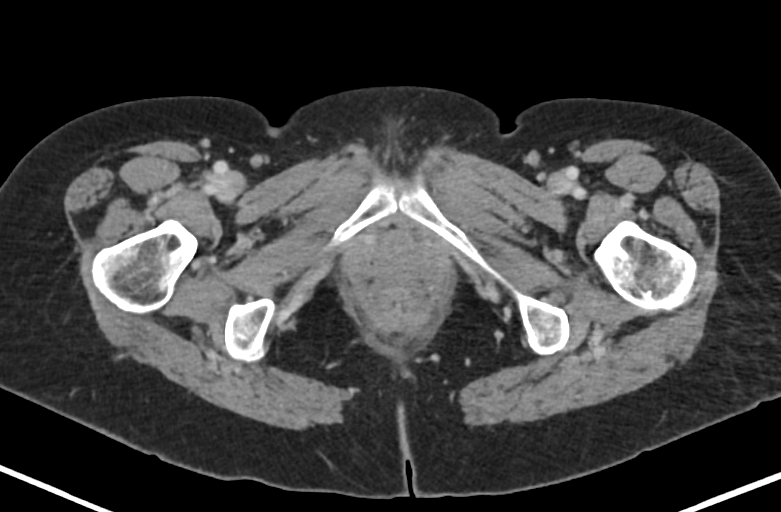
[im 17/142  bone]
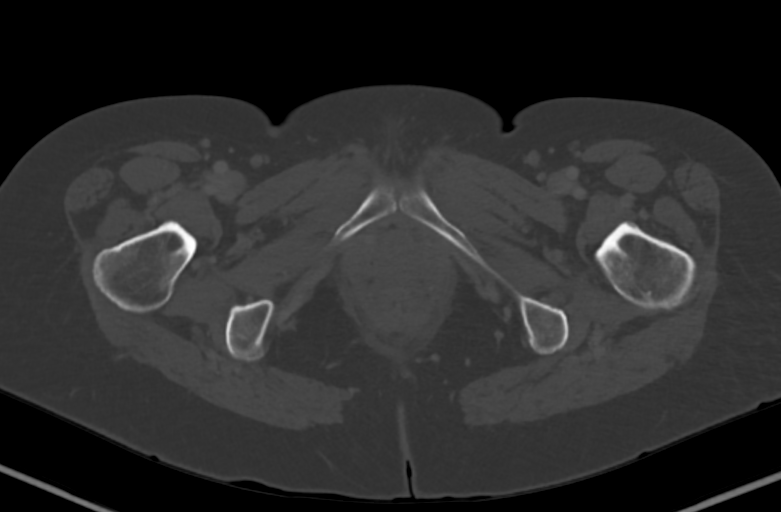
[im 34/142  soft-tissue]
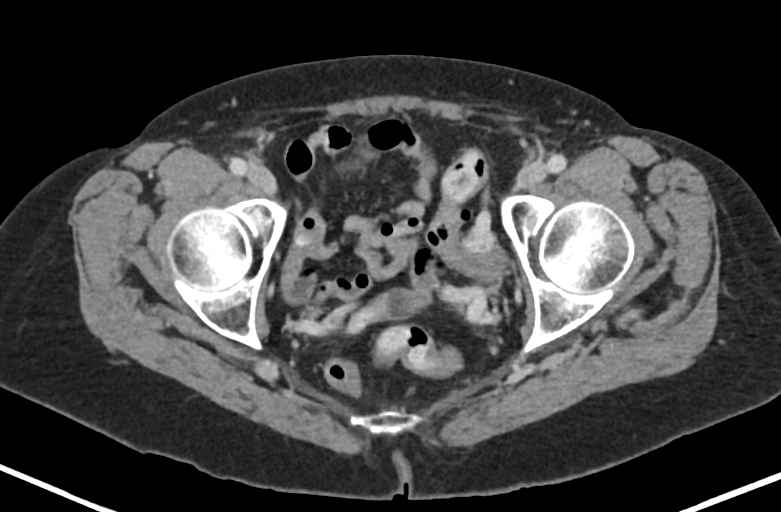
[im 50/142  soft-tissue]
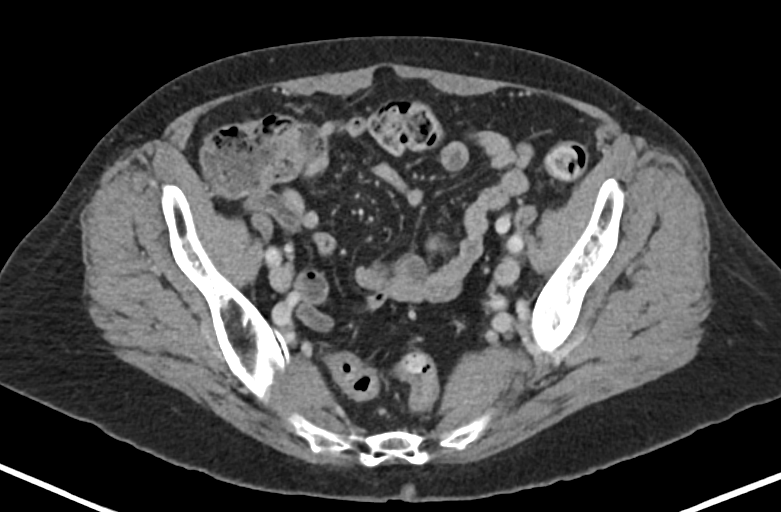
[im 67/142  soft-tissue]
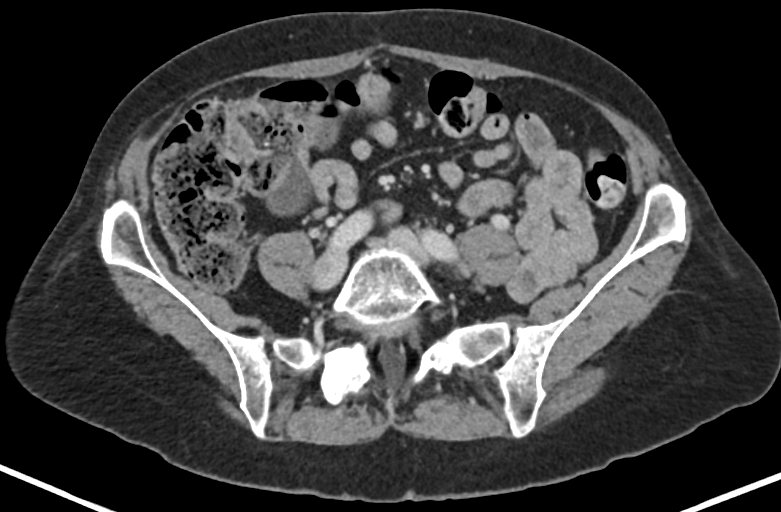
[im 83/142  soft-tissue]
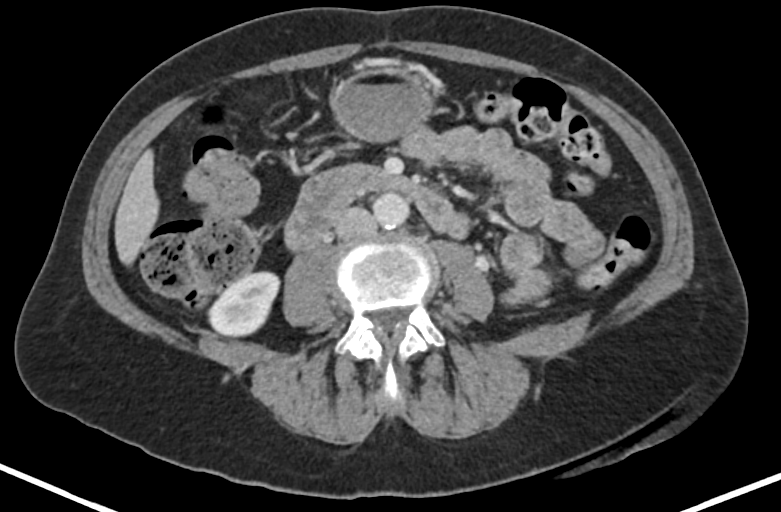
[im 100/142  soft-tissue]
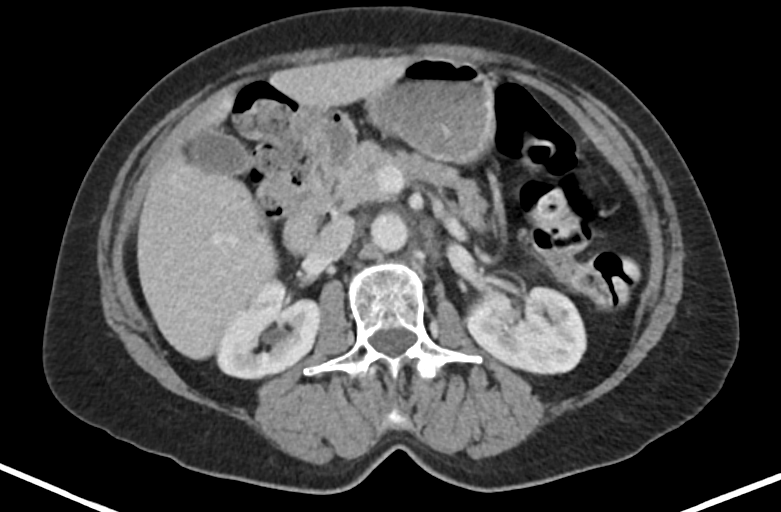
[im 117/142  soft-tissue]
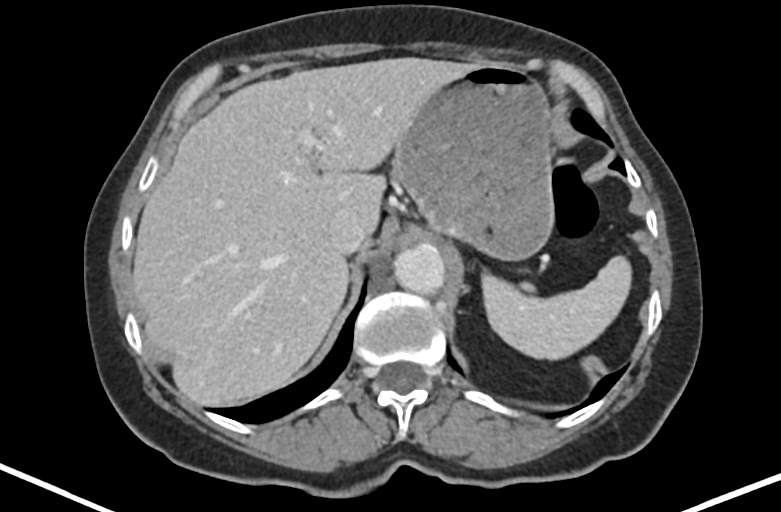
[im 133/142  soft-tissue]
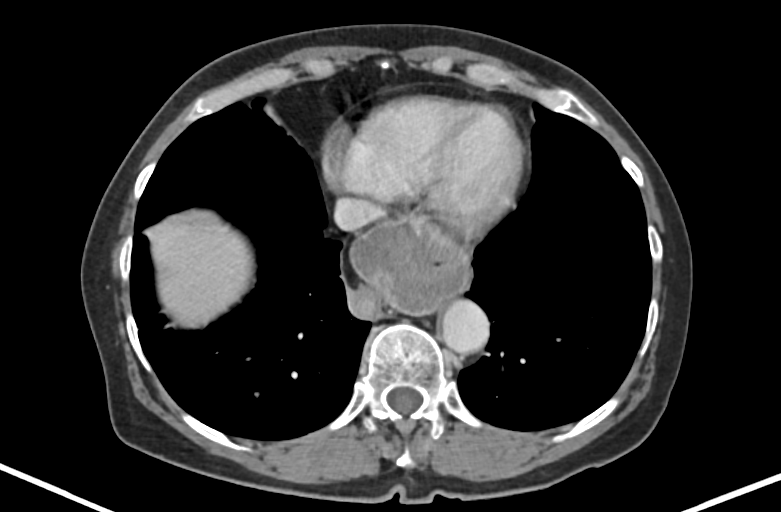

[Series 8: coronal · coronal · 0.76mm/px · 3 of 85 slices shown]
[im 29/85  soft-tissue]
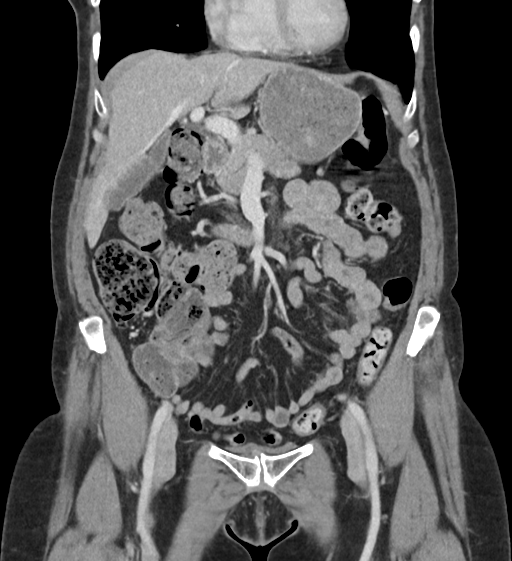
[im 38/85  soft-tissue]
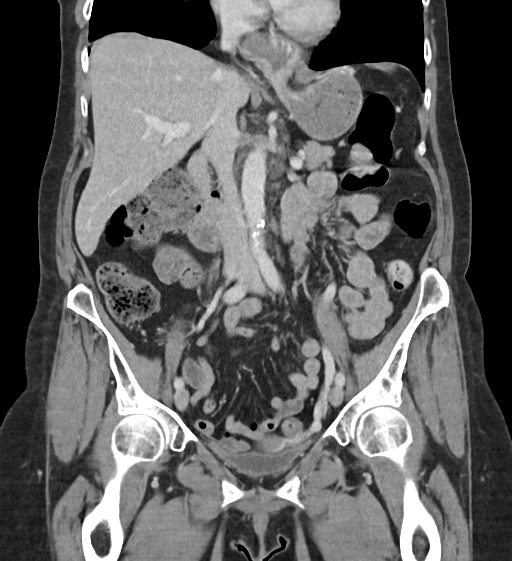
[im 47/85  soft-tissue]
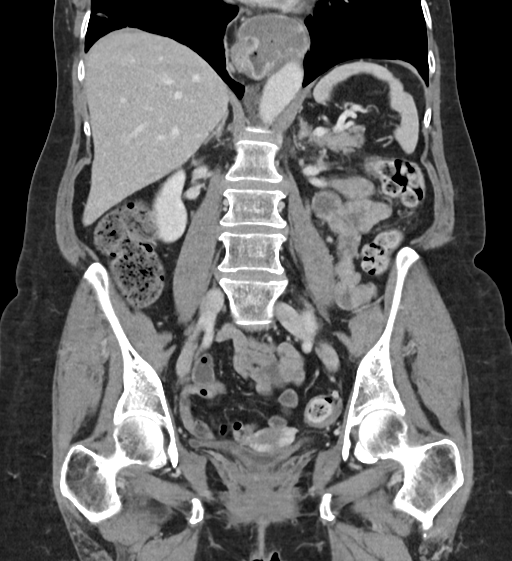

[11 of 46 positions shown; findings below may reference images not displayed]

FINDINGS: CT ABDOMEN: Scarring at the right middle lobe and lateral right lower lobe is better evaluated on this exam compared to prior PET/CT imaging due to thinner axial CT lung slices. No suspicious
pulmonary nodules are evident. Heart is upper normal in size. Moderate to large paraesophageal hernia is seen posterior to heart, stable.
Imaging below the diaphragm demonstrates normal enhancement of the solid organs. Gallbladder is unremarkable. Renal collecting systems are normal. No nephrolithiasis or ureterolithiasis is evident.
Mild diverticulosis of the descending colon is noted. Calcification abdominal aorta is noted without aneurysm. Appendix is surgically absent.
CT PELVIS: Diverticulosis of the sigmoid colon is noted without acute diverticulitis. Atrophic uterus is seen. Urinary bladder is decompressed. Vasculature is unremarkable. No pelvic adenopathy is
identified.
Osseous structures: Spine appears unremarkable except for posterior element hypertrophy of the lower lumbar spine, greatest at L5-S1 on the right disc change at L5-S1. Osteoarthritis at joints the
pelvis. Appears age-related.
IMPRESSION: 1. Diverticulosis of the descending and sigmoid colon. There is mild-to-moderate stool retention within the ascending colon. Appendix is surgically absent.
2. Normal enhancement of the liver without convincing focal mass. Moderate to large paraesophageal hernia is evident, unchanged.
3. Skeletal arthritic changes as detailed above. Appendix is surgically absent.
